# Patient Record
Sex: Female | Born: 1960 | Race: White | Hispanic: No | Marital: Married | Smoking: Former smoker
Health system: Southern US, Community
[De-identification: ages and names within clinical notes are randomized; demographics above are authoritative.]

## PROBLEM LIST (undated history)

## (undated) DIAGNOSIS — J45909 Unspecified asthma, uncomplicated: Secondary | ICD-10-CM

## (undated) DIAGNOSIS — E039 Hypothyroidism, unspecified: Secondary | ICD-10-CM

## (undated) DIAGNOSIS — I341 Nonrheumatic mitral (valve) prolapse: Secondary | ICD-10-CM

## (undated) DIAGNOSIS — T7840XA Allergy, unspecified, initial encounter: Secondary | ICD-10-CM

## (undated) DIAGNOSIS — J349 Unspecified disorder of nose and nasal sinuses: Secondary | ICD-10-CM

## (undated) DIAGNOSIS — Z8709 Personal history of other diseases of the respiratory system: Secondary | ICD-10-CM

## (undated) HISTORY — DX: Personal history of other diseases of the respiratory system: Z87.09

## (undated) HISTORY — DX: Allergy, unspecified, initial encounter: T78.40XA

## (undated) HISTORY — DX: Unspecified asthma, uncomplicated: J45.909

## (undated) HISTORY — DX: Unspecified disorder of nose and nasal sinuses: J34.9

---

## 1995-04-12 HISTORY — PX: BREAST LUMPECTOMY: SHX2

## 1998-02-19 ENCOUNTER — Other Ambulatory Visit: Admission: RE | Admit: 1998-02-19 | Discharge: 1998-02-19 | Payer: Self-pay | Admitting: Obstetrics and Gynecology

## 1999-03-25 ENCOUNTER — Other Ambulatory Visit: Admission: RE | Admit: 1999-03-25 | Discharge: 1999-03-25 | Payer: Self-pay | Admitting: Obstetrics and Gynecology

## 1999-03-30 ENCOUNTER — Encounter: Admission: RE | Admit: 1999-03-30 | Discharge: 1999-03-30 | Payer: Self-pay | Admitting: Obstetrics and Gynecology

## 1999-03-30 ENCOUNTER — Encounter: Payer: Self-pay | Admitting: Obstetrics and Gynecology

## 2000-04-06 ENCOUNTER — Other Ambulatory Visit: Admission: RE | Admit: 2000-04-06 | Discharge: 2000-04-06 | Payer: Self-pay | Admitting: Obstetrics and Gynecology

## 2001-05-21 ENCOUNTER — Other Ambulatory Visit: Admission: RE | Admit: 2001-05-21 | Discharge: 2001-05-21 | Payer: Self-pay | Admitting: Obstetrics and Gynecology

## 2001-11-14 ENCOUNTER — Encounter: Payer: Self-pay | Admitting: Obstetrics and Gynecology

## 2001-11-14 ENCOUNTER — Encounter: Admission: RE | Admit: 2001-11-14 | Discharge: 2001-11-14 | Payer: Self-pay | Admitting: Obstetrics and Gynecology

## 2002-06-03 ENCOUNTER — Other Ambulatory Visit: Admission: RE | Admit: 2002-06-03 | Discharge: 2002-06-03 | Payer: Self-pay | Admitting: Obstetrics and Gynecology

## 2002-12-03 ENCOUNTER — Encounter: Admission: RE | Admit: 2002-12-03 | Discharge: 2002-12-03 | Payer: Self-pay | Admitting: Obstetrics and Gynecology

## 2002-12-03 ENCOUNTER — Encounter: Payer: Self-pay | Admitting: Obstetrics and Gynecology

## 2003-06-17 ENCOUNTER — Other Ambulatory Visit: Admission: RE | Admit: 2003-06-17 | Discharge: 2003-06-17 | Payer: Self-pay | Admitting: Obstetrics and Gynecology

## 2004-03-19 ENCOUNTER — Encounter: Admission: RE | Admit: 2004-03-19 | Discharge: 2004-03-19 | Payer: Self-pay | Admitting: Obstetrics and Gynecology

## 2004-05-10 ENCOUNTER — Ambulatory Visit: Payer: Self-pay | Admitting: Internal Medicine

## 2004-05-24 ENCOUNTER — Ambulatory Visit: Payer: Self-pay | Admitting: Internal Medicine

## 2004-06-02 ENCOUNTER — Ambulatory Visit: Payer: Self-pay

## 2004-07-07 ENCOUNTER — Encounter: Admission: RE | Admit: 2004-07-07 | Discharge: 2004-10-05 | Payer: Self-pay | Admitting: Obstetrics and Gynecology

## 2005-04-18 ENCOUNTER — Encounter: Admission: RE | Admit: 2005-04-18 | Discharge: 2005-04-18 | Payer: Self-pay | Admitting: Obstetrics and Gynecology

## 2005-04-22 ENCOUNTER — Ambulatory Visit: Payer: Self-pay | Admitting: Internal Medicine

## 2008-03-16 ENCOUNTER — Emergency Department (HOSPITAL_BASED_OUTPATIENT_CLINIC_OR_DEPARTMENT_OTHER): Admission: EM | Admit: 2008-03-16 | Discharge: 2008-03-16 | Payer: Self-pay | Admitting: Emergency Medicine

## 2008-03-16 ENCOUNTER — Ambulatory Visit: Payer: Self-pay | Admitting: Radiology

## 2008-03-18 ENCOUNTER — Telehealth: Payer: Self-pay | Admitting: Internal Medicine

## 2008-03-20 ENCOUNTER — Telehealth (INDEPENDENT_AMBULATORY_CARE_PROVIDER_SITE_OTHER): Payer: Self-pay | Admitting: *Deleted

## 2008-03-20 ENCOUNTER — Ambulatory Visit: Payer: Self-pay | Admitting: Internal Medicine

## 2008-03-20 DIAGNOSIS — J939 Pneumothorax, unspecified: Secondary | ICD-10-CM | POA: Insufficient documentation

## 2008-03-20 DIAGNOSIS — J930 Spontaneous tension pneumothorax: Secondary | ICD-10-CM

## 2008-05-13 ENCOUNTER — Encounter: Admission: RE | Admit: 2008-05-13 | Discharge: 2008-05-13 | Payer: Self-pay | Admitting: Obstetrics and Gynecology

## 2009-03-17 ENCOUNTER — Telehealth (INDEPENDENT_AMBULATORY_CARE_PROVIDER_SITE_OTHER): Payer: Self-pay | Admitting: *Deleted

## 2009-03-17 DIAGNOSIS — R079 Chest pain, unspecified: Secondary | ICD-10-CM | POA: Insufficient documentation

## 2009-03-18 ENCOUNTER — Ambulatory Visit: Payer: Self-pay | Admitting: Internal Medicine

## 2009-03-19 ENCOUNTER — Telehealth (INDEPENDENT_AMBULATORY_CARE_PROVIDER_SITE_OTHER): Payer: Self-pay | Admitting: *Deleted

## 2009-03-19 ENCOUNTER — Encounter (INDEPENDENT_AMBULATORY_CARE_PROVIDER_SITE_OTHER): Payer: Self-pay | Admitting: *Deleted

## 2009-03-19 ENCOUNTER — Telehealth: Payer: Self-pay | Admitting: Family Medicine

## 2009-03-19 LAB — CONVERTED CEMR LAB
Basophils Relative: 1 % (ref 0.0–3.0)
CK-MB: 0.9 ng/mL (ref 0.3–4.0)
Eosinophils Relative: 3 % (ref 0.0–5.0)
Lymphocytes Relative: 31.8 % (ref 12.0–46.0)
Lymphs Abs: 2.4 10*3/uL (ref 0.7–4.0)
MCHC: 34 g/dL (ref 30.0–36.0)
MCV: 90.6 fL (ref 78.0–100.0)
Monocytes Absolute: 0.4 10*3/uL (ref 0.1–1.0)
Monocytes Relative: 5.2 % (ref 3.0–12.0)
RBC: 3.96 M/uL (ref 3.87–5.11)
RDW: 11.6 % (ref 11.5–14.6)
WBC: 7.4 10*3/uL (ref 4.5–10.5)

## 2009-06-05 ENCOUNTER — Encounter: Admission: RE | Admit: 2009-06-05 | Discharge: 2009-06-05 | Payer: Self-pay | Admitting: Obstetrics and Gynecology

## 2009-06-11 ENCOUNTER — Encounter: Admission: RE | Admit: 2009-06-11 | Discharge: 2009-06-11 | Payer: Self-pay | Admitting: Obstetrics and Gynecology

## 2009-09-10 ENCOUNTER — Encounter (INDEPENDENT_AMBULATORY_CARE_PROVIDER_SITE_OTHER): Payer: Self-pay | Admitting: *Deleted

## 2009-09-10 ENCOUNTER — Ambulatory Visit: Payer: Self-pay | Admitting: Internal Medicine

## 2009-09-10 DIAGNOSIS — R51 Headache: Secondary | ICD-10-CM

## 2009-09-10 DIAGNOSIS — E039 Hypothyroidism, unspecified: Secondary | ICD-10-CM | POA: Insufficient documentation

## 2009-09-10 DIAGNOSIS — R519 Headache, unspecified: Secondary | ICD-10-CM | POA: Insufficient documentation

## 2009-09-14 LAB — CONVERTED CEMR LAB: TSH: 1.83 microintl units/mL (ref 0.35–5.50)

## 2010-05-12 ENCOUNTER — Other Ambulatory Visit: Payer: Self-pay | Admitting: Obstetrics and Gynecology

## 2010-05-12 DIAGNOSIS — Z1239 Encounter for other screening for malignant neoplasm of breast: Secondary | ICD-10-CM

## 2010-05-13 NOTE — Letter (Signed)
Summary: Work Dietitian at [employer]  [address] [place], [place] [address]   Phone: [phone]  Fax: [phone]    Today's Date: September 10, 2009  Name of Patient: Elizabeth Roth  The above named patient had a medical visit today at:  am / 1:30pm.  Please take this into consideration when reviewing the time away from work/school.    Special Instructions:  [  ] None  [X To be off the remainder of today, returning to the normal work / school schedule tomorrow.  [  ] To be off until the next scheduled appointment on ______________________.  [  ] Other ________________________________________________________________ ________________________________________________________________________   Sincerely yours,   Shonna Chock

## 2010-05-13 NOTE — Assessment & Plan Note (Signed)
Summary: headache- lab tsh:244.9 for ins app/cbs   Vital Signs:  Patient profile:   50 year old female Weight:      153 pounds Temp:     98.4 degrees F oral Pulse rate:   60 / minute Resp:     15 per minute BP sitting:   130 / 82  (left arm) Cuff size:   large  Vitals Entered By: Shonna Chock (September 10, 2009 1:45 PM) CC: Headache-? Sinus related, upper neck is tender to the touch, and TSH check, Headaches Comments REVIEWED MED LIST, PATIENT AGREED DOSE AND INSTRUCTION CORRECT    CC:  Headache-? Sinus related, upper neck is tender to the touch, and TSH check, and Headaches.  History of Present Illness:  Headaches      This is a 50 year old woman who presents with Headaches X 1 week.  The patient reports nausea, tearing of eyes, nasal congestion, sinus pain, sinus pressure, and photophobia, but denies vomiting, sweats, and phonophobia.  The headache is described as constant and dull.  The location of the pain is  frontal &  bilateral.  The patient denies the following high-risk features: fever, neck pain/stiffness, vision loss or change, focal weakness, altered mental status, rash, trauma, pain worse with exertion, and new type of headache. There is some tenderness @ the posterior inferior scalp line The headaches are precipitated by change in weather.  Prior treatment has included a NSAID  ( Aleve D) and Excedrin Migraine with partial benefit . PMH of Menstrual Migraine last week of May. No excess caffeine  , but Aleve & Excedrin Migraine  once daily on average for past week. Prodrome of fatigue & OS twitching ; no aura with  migraines.  Allergies: 1)  ! Erythromycin  Past History:  Past Medical History: Migraines , Menstrual, PMH of  Review of Systems General:  Denies chills and sweats. Eyes:  Denies blurring, double vision, and vision loss-both eyes. ENT:  Complains of postnasal drainage; denies ear discharge; No facial pain or purulence. Ear pressure. Resp:  Complains of cough;  denies sputum productive. Neuro:  Denies brief paralysis, numbness, tingling, and weakness; Pain in RUE from neck to shoulder lifting suitcase 05/19.  Physical Exam  General:  well-nourished,in no acute distress; alert,appropriate and cooperative throughout examination Eyes:  No corneal or conjunctival inflammation noted. EOMI. Perrla. Field of Vision grossly normal. Vision WNL Ears:  External ear exam shows no significant lesions or deformities.  Otoscopic examination reveals clear canals, tympanic membranes are intact bilaterally without bulging, retraction, inflammation or discharge. Hearing is grossly normal bilaterally. Nose:  External nasal examination shows no deformity or inflammation. Nasal mucosa are pink and moist without lesions or exudates. Mouth:  Oral mucosa and oropharynx without lesions or exudates.  Teeth in good repair. Neck:  No deformities, masses, or tenderness noted. Supple Lungs:  Normal respiratory effort, chest expands symmetrically. Lungs are clear to auscultation, no crackles or wheezes. Heart:  Normal rate and regular rhythm. S1 and S2 normal without gallop, murmur, click, rub . S4 Pulses:  R and L carotid  pulses are full and equal bilaterally Extremities:  No clubbing, cyanosis, edema. Neg SLR Neurologic:  alert & oriented X3, cranial nerves II-XII intact, strength normal in all extremities, sensation intact to light touch,heel/ toe gait normal, DTRs symmetrical and normal, finger-to-nose normal, and Romberg negative.   Skin:  Intact without suspicious lesions or rashes Cervical Nodes:  No lymphadenopathy noted Axillary Nodes:  No palpable lymphadenopathy Psych:  memory intact for recent and remote, normally interactive, and good eye contact.     Impression & Recommendations:  Problem # 1:  HEADACHE (ICD-784.0)  Chronic Daily Headache as converted Migraine due to caffeine & NSAIDS  Her updated medication list for this problem includes:    Maxalt-mlt 10 Mg  Tbdp (Rizatriptan benzoate) .Marland Kitchen... 1 once daily as needed migraine  Complete Medication List: 1)  Synthroid 50 Mcg Tabs (Levothyroxine sodium) .Marland Kitchen.. 1 by mouth once daily 2)  Trinessa (28) 0.035 Mg Tabs (Norgestimate-ethinyl estradiol) .... As directed 3)  Ranitidine Hcl 150 Mg Tabs (Ranitidine hcl) .Marland Kitchen.. 1 two times a day pre meal 4)  Maxalt-mlt 10 Mg Tbdp (Rizatriptan benzoate) .Marland Kitchen.. 1 once daily as needed migraine 5)  Fluticasone Propionate 50 Mcg/act Susp (Fluticasone propionate) .Marland Kitchen.. 1 spray two times a day as needed  Other Orders: TLB-TSH (Thyroid Stimulating Hormone) (84443-TSH)  Patient Instructions: 1)  Neti pot once daily as needed for congestion.Keep a   Headache Diary; take Excedrin Migraine only with the Prodrome. Prescriptions: FLUTICASONE PROPIONATE 50 MCG/ACT SUSP (FLUTICASONE PROPIONATE) 1 spray two times a day as needed  #1 x 5   Entered and Authorized by:   Marga Melnick MD   Signed by:   Marga Melnick MD on 09/10/2009   Method used:   Print then Give to Patient   RxID:   [id number] MAXALT-MLT 10 MG TBDP (RIZATRIPTAN BENZOATE) 1 once daily as needed migraine  #6 x 1   Entered and Authorized by:   Marga Melnick MD   Signed by:   Marga Melnick MD on 09/10/2009   Method used:   Print then Give to Patient   RxID:   [id number]

## 2010-06-18 ENCOUNTER — Ambulatory Visit
Admission: RE | Admit: 2010-06-18 | Discharge: 2010-06-18 | Disposition: A | Discharge status: Home / Self Care | Payer: 59 | Source: Ambulatory Visit | Attending: Obstetrics and Gynecology | Admitting: Obstetrics and Gynecology

## 2010-06-18 DIAGNOSIS — Z1239 Encounter for other screening for malignant neoplasm of breast: Secondary | ICD-10-CM

## 2010-07-27 ENCOUNTER — Inpatient Hospital Stay (INDEPENDENT_AMBULATORY_CARE_PROVIDER_SITE_OTHER)
Admission: RE | Admit: 2010-07-27 | Discharge: 2010-07-27 | Disposition: A | Discharge status: Home / Self Care | Payer: 59 | Source: Ambulatory Visit | Attending: Family Medicine | Admitting: Family Medicine

## 2010-07-27 DIAGNOSIS — J019 Acute sinusitis, unspecified: Secondary | ICD-10-CM

## 2010-10-11 ENCOUNTER — Encounter: Payer: Self-pay | Admitting: Family Medicine

## 2010-10-11 ENCOUNTER — Ambulatory Visit (HOSPITAL_BASED_OUTPATIENT_CLINIC_OR_DEPARTMENT_OTHER)
Admission: RE | Admit: 2010-10-11 | Discharge: 2010-10-11 | Disposition: A | Discharge status: Home / Self Care | Payer: 59 | Source: Ambulatory Visit | Attending: Family Medicine | Admitting: Family Medicine

## 2010-10-11 ENCOUNTER — Ambulatory Visit (INDEPENDENT_AMBULATORY_CARE_PROVIDER_SITE_OTHER): Payer: 59 | Admitting: Family Medicine

## 2010-10-11 VITALS — BP 130/82 | Temp 98.9°F | Wt 156.6 lb

## 2010-10-11 DIAGNOSIS — J40 Bronchitis, not specified as acute or chronic: Secondary | ICD-10-CM

## 2010-10-11 DIAGNOSIS — R059 Cough, unspecified: Secondary | ICD-10-CM | POA: Insufficient documentation

## 2010-10-11 DIAGNOSIS — R05 Cough: Secondary | ICD-10-CM | POA: Insufficient documentation

## 2010-10-11 MED ORDER — FLUTICASONE-SALMETEROL 250-50 MCG/DOSE IN AEPB: 1.0000 | INHALATION_SPRAY | Freq: Two times a day (BID) | RESPIRATORY_TRACT | Status: DC

## 2010-10-11 MED ORDER — AZITHROMYCIN 250 MG PO TABS: 250.0000 mg | ORAL_TABLET | Freq: Every day | ORAL | Status: AC

## 2010-10-11 MED ORDER — GUAIFENESIN-CODEINE 100-10 MG/5ML PO SYRP: 5.0000 mL | ORAL_SOLUTION | Freq: Two times a day (BID) | ORAL | Status: DC | PRN

## 2010-10-11 MED ORDER — BENZONATATE 200 MG PO CAPS: 200.0000 mg | ORAL_CAPSULE | Freq: Three times a day (TID) | ORAL | Status: AC | PRN

## 2010-10-11 NOTE — Progress Notes (Signed)
  Subjective:    Patient ID: Garret Reddish, female    DOB: 01-Jan-1961, 50 y.o.   MRN: [id number]  HPI URI- sxs started Friday w/ sore throat, runny nose, slight cough.  Cough has progressed and now chest is tight w/ some shortness of breath.  Reports Dr Alwyn Ren previously gave her Advair which improved her sxs.  No fevers.  No ear pain.  No facial pain/pressure.  Biggest concern is chest tightness.   Review of Systems For ROS see HPI     Objective:   Physical Exam  Vitals reviewed. Constitutional: She appears well-developed and well-nourished. No distress.  HENT:  Head: Normocephalic and atraumatic.       TMs normal bilaterally Mild nasal congestion Throat w/out erythema, edema, or exudate  Eyes: Conjunctivae and EOM are normal. Pupils are equal, round, and reactive to light.  Neck: Normal range of motion. Neck supple.  Cardiovascular: Normal rate, regular rhythm, normal heart sounds and intact distal pulses.   No murmur heard. Pulmonary/Chest: Effort normal and breath sounds normal. No respiratory distress. She has no wheezes.       + hacking cough  Lymphadenopathy:    She has no cervical adenopathy.          Assessment & Plan:

## 2010-10-11 NOTE — Patient Instructions (Signed)
Go to the [facility] and get your [facility]- we'll call you with the results Take the Azithromycin as directed Use the cough meds as needed- the syrup will make you drowsy Use the inhaler as directed to open your lungs Call with any questions or concerns Hang in there!!!

## 2010-10-13 NOTE — Assessment & Plan Note (Signed)
Pt's hx and PE consistent w/ bronchitis.  Given hx of spontaneous pneumo will get CXR.  Start abx.  Cough meds prn.  Reviewed supportive care and red flags that should prompt return.  Pt expressed understanding and is in agreement w/ plan.

## 2010-12-14 ENCOUNTER — Other Ambulatory Visit: Payer: Self-pay | Admitting: Internal Medicine

## 2011-01-14 LAB — RAPID STREP SCREEN (MED CTR MEBANE ONLY): Streptococcus, Group A Screen (Direct): NEGATIVE

## 2011-07-18 ENCOUNTER — Other Ambulatory Visit: Payer: Self-pay | Admitting: Obstetrics and Gynecology

## 2011-07-18 DIAGNOSIS — Z1231 Encounter for screening mammogram for malignant neoplasm of breast: Secondary | ICD-10-CM

## 2011-07-22 ENCOUNTER — Ambulatory Visit
Admission: RE | Admit: 2011-07-22 | Discharge: 2011-07-22 | Disposition: A | Discharge status: Home / Self Care | Payer: 59 | Source: Ambulatory Visit | Attending: Obstetrics and Gynecology | Admitting: Obstetrics and Gynecology

## 2011-07-22 DIAGNOSIS — Z1231 Encounter for screening mammogram for malignant neoplasm of breast: Secondary | ICD-10-CM

## 2011-09-13 ENCOUNTER — Other Ambulatory Visit: Payer: Self-pay | Admitting: Obstetrics and Gynecology

## 2011-09-14 ENCOUNTER — Encounter (HOSPITAL_COMMUNITY): Payer: Self-pay

## 2011-09-20 ENCOUNTER — Encounter (HOSPITAL_COMMUNITY): Payer: Self-pay

## 2011-09-20 ENCOUNTER — Encounter (HOSPITAL_COMMUNITY)
Admission: RE | Admit: 2011-09-20 | Discharge: 2011-09-20 | Disposition: A | Discharge status: Home / Self Care | Payer: 59 | Source: Ambulatory Visit | Attending: Obstetrics and Gynecology | Admitting: Obstetrics and Gynecology

## 2011-09-20 HISTORY — DX: Hypothyroidism, unspecified: E03.9

## 2011-09-20 HISTORY — DX: Nonrheumatic mitral (valve) prolapse: I34.1

## 2011-09-20 LAB — CBC
MCH: 29.8 pg (ref 26.0–34.0)
MCHC: 33.5 g/dL (ref 30.0–36.0)
RDW: 12.5 % (ref 11.5–15.5)
WBC: 8.1 10*3/uL (ref 4.0–10.5)

## 2011-09-20 LAB — SURGICAL PCR SCREEN: MRSA, PCR: NEGATIVE

## 2011-09-20 NOTE — Patient Instructions (Signed)
YOUR PROCEDURE IS SCHEDULED ON:09/26/11  ENTER THROUGH THE MAIN ENTRANCE OF [facility] AT:6am  USE DESK PHONE AND DIAL [phone] TO INFORM us OF YOUR ARRIVAL  CALL [phone] IF YOU HAVE ANY QUESTIONS OR PROBLEMS PRIOR TO YOUR ARRIVAL.  REMEMBER: DO NOT EAT OR DRINK AFTER MIDNIGHT :Sunday    YOU MAY BRUSH YOUR TEETH THE MORNING OF SURGERY   TAKE THESE MEDICINES THE DAY OF SURGERY WITH SIP OF WATER:thyroid med   DO NOT WEAR JEWELRY, EYE MAKEUP, LIPSTICK OR DARK FINGERNAIL POLISH DO NOT WEAR LOTIONS  DO NOT SHAVE FOR 48 HOURS PRIOR TO SURGERY  YOU WILL NOT BE ALLOWED TO DRIVE YOURSELF HOME.

## 2011-09-24 NOTE — H&P (Signed)
Elizabeth Roth, Elizabeth Roth               ACCOUNT NO.:  [id number]  MEDICAL RECORD NO.:  [id number]  LOCATION:  SDC                           FACILITY:  [facility]  PHYSICIAN:  Lenoard Aden, M.D.DATE OF BIRTH:  1960/12/15  DATE OF ADMISSION:  09/20/2011 DATE OF DISCHARGE:  09/20/2011                             HISTORY & PHYSICAL   CHIEF COMPLAINT:  Symptomatic fibroids and pelvic pain.  HISTORY OF PRESENT ILLNESS:  A 51 year old white female, G2, P2 with symptomatic enlarging fibroids for definitive therapy.  ALLERGIES:  She has allergies to LATEX, PENICILLIN, and ERYTHROMYCIN.  SOCIAL HISTORY:  She is a nonsmoker, nondrinker.  She denies domestic or physical violence.  FAMILY HISTORY:  Chronic hypertension, heart disease, diabetes, breast cancer.  PAST MEDICAL HISTORY:  She has a history of 2 vaginal deliveries.  No other surgical or medical hospitalizations.  MEDICATIONS:  Synthroid and birth control pills.  PHYSICAL EXAMINATION:  GENERAL:  She is a well-developed, well- nourished, white female. VITAL SIGNS:  Height is 64 inches, weight of 145 pounds. HEENT:  Normal. NECK:  Supple.  Full range of motion. LUNGS:  Clear to auscultation. HEART:  Regular rate and rhythm. ABDOMEN:  Soft, nontender. PELVIC:  The uterus to be about 10- to 12-week size.  No adnexal masses. EXTREMITIES:  Without cords. NEUROLOGIC:  Nonfocal. SKIN:  Intact.  IMPRESSION:  Symptomatic enlarging fibroids for definitive therapy.  PLAN:  Proceed with da Vinci TLH, bilateral salpingectomy.  Risks of anesthesia, infection, bleeding, injury to abdominal organs, need for repair is discussed, delayed verus immediate complications to include bowel or bladder injury noted.  The patient acknowledges and wishes to proceed.     Lenoard Aden, M.D.     RJT/MEDQ  D:  09/24/2011  T:  09/24/2011  Job:  [id number]

## 2011-09-26 ENCOUNTER — Encounter (HOSPITAL_COMMUNITY): Payer: Self-pay | Admitting: Anesthesiology

## 2011-09-26 ENCOUNTER — Encounter (HOSPITAL_COMMUNITY): Payer: Self-pay

## 2011-09-26 ENCOUNTER — Ambulatory Visit (HOSPITAL_COMMUNITY): Payer: 59 | Admitting: Anesthesiology

## 2011-09-26 ENCOUNTER — Encounter (HOSPITAL_COMMUNITY)
Admission: RE | Disposition: A | Discharge status: Home / Self Care | Payer: Self-pay | Source: Ambulatory Visit | Attending: Obstetrics and Gynecology

## 2011-09-26 ENCOUNTER — Ambulatory Visit (HOSPITAL_COMMUNITY)
Admission: RE | Admit: 2011-09-26 | Discharge: 2011-09-27 | Disposition: A | Discharge status: Home / Self Care | Payer: 59 | Source: Ambulatory Visit | Attending: Obstetrics and Gynecology | Admitting: Obstetrics and Gynecology

## 2011-09-26 DIAGNOSIS — D250 Submucous leiomyoma of uterus: Secondary | ICD-10-CM | POA: Insufficient documentation

## 2011-09-26 DIAGNOSIS — D252 Subserosal leiomyoma of uterus: Secondary | ICD-10-CM | POA: Insufficient documentation

## 2011-09-26 DIAGNOSIS — Z01812 Encounter for preprocedural laboratory examination: Secondary | ICD-10-CM | POA: Insufficient documentation

## 2011-09-26 DIAGNOSIS — Z01818 Encounter for other preprocedural examination: Secondary | ICD-10-CM | POA: Insufficient documentation

## 2011-09-26 HISTORY — PX: TOTAL ABDOMINAL HYSTERECTOMY: SHX209

## 2011-09-26 LAB — HCG, SERUM, QUALITATIVE: Preg, Serum: NEGATIVE

## 2011-09-26 SURGERY — ROBOTIC ASSISTED TOTAL HYSTERECTOMY
Anesthesia: General | Wound class: Clean Contaminated

## 2011-09-26 MED ORDER — CEFAZOLIN SODIUM 1-5 GM-% IV SOLN
INTRAVENOUS | Status: AC
  Filled 2011-09-26: qty 50

## 2011-09-26 MED ORDER — DIPHENHYDRAMINE HCL 50 MG/ML IJ SOLN
INTRAMUSCULAR | Status: AC
  Filled 2011-09-26: qty 1

## 2011-09-26 MED ORDER — ROCURONIUM BROMIDE 50 MG/5ML IV SOLN
INTRAVENOUS | Status: AC
  Filled 2011-09-26: qty 1

## 2011-09-26 MED ORDER — TRAMADOL HCL 50 MG PO TABS: 50.0000 mg | ORAL_TABLET | Freq: Four times a day (QID) | ORAL | Status: DC | PRN

## 2011-09-26 MED ORDER — MEPERIDINE HCL 25 MG/ML IJ SOLN: 6.2500 mg | INTRAMUSCULAR | Status: DC | PRN

## 2011-09-26 MED ORDER — NALOXONE HCL 0.4 MG/ML IJ SOLN: 0.4000 mg | INTRAMUSCULAR | Status: DC | PRN

## 2011-09-26 MED ORDER — ONDANSETRON HCL 4 MG/2ML IJ SOLN: 4.0000 mg | Freq: Four times a day (QID) | INTRAMUSCULAR | Status: DC | PRN

## 2011-09-26 MED ORDER — DIPHENHYDRAMINE HCL 12.5 MG/5ML PO ELIX: 12.5000 mg | ORAL_SOLUTION | Freq: Four times a day (QID) | ORAL | Status: DC | PRN

## 2011-09-26 MED ORDER — FENTANYL CITRATE 0.05 MG/ML IJ SOLN
INTRAMUSCULAR | Status: AC
  Administered 2011-09-26: 50 ug via INTRAVENOUS
  Filled 2011-09-26: qty 2

## 2011-09-26 MED ORDER — FENTANYL CITRATE 0.05 MG/ML IJ SOLN
25.0000 ug | INTRAMUSCULAR | Status: DC | PRN
  Administered 2011-09-26 (×2): 50 ug via INTRAVENOUS

## 2011-09-26 MED ORDER — PROPOFOL 10 MG/ML IV EMUL
INTRAVENOUS | Status: DC | PRN
  Administered 2011-09-26: 50 mg via INTRAVENOUS
  Administered 2011-09-26: 150 mg via INTRAVENOUS

## 2011-09-26 MED ORDER — DIPHENHYDRAMINE HCL 50 MG/ML IJ SOLN
INTRAMUSCULAR | Status: DC | PRN
  Administered 2011-09-26: 12.5 mg via INTRAVENOUS

## 2011-09-26 MED ORDER — DEXAMETHASONE SODIUM PHOSPHATE 10 MG/ML IJ SOLN
INTRAMUSCULAR | Status: DC | PRN
  Administered 2011-09-26: 10 mg via INTRAVENOUS

## 2011-09-26 MED ORDER — LIDOCAINE HCL (CARDIAC) 20 MG/ML IV SOLN
INTRAVENOUS | Status: AC
  Filled 2011-09-26: qty 5

## 2011-09-26 MED ORDER — ALBUTEROL SULFATE HFA 108 (90 BASE) MCG/ACT IN AERS
INHALATION_SPRAY | RESPIRATORY_TRACT | Status: DC | PRN
  Administered 2011-09-26: 2 via RESPIRATORY_TRACT

## 2011-09-26 MED ORDER — HYDROMORPHONE HCL PF 1 MG/ML IJ SOLN
INTRAMUSCULAR | Status: DC | PRN
  Administered 2011-09-26: 1 mg via INTRAVENOUS

## 2011-09-26 MED ORDER — ARTIFICIAL TEARS OP OINT
TOPICAL_OINTMENT | OPHTHALMIC | Status: AC
  Filled 2011-09-26: qty 3.5

## 2011-09-26 MED ORDER — KETOROLAC TROMETHAMINE 30 MG/ML IJ SOLN
INTRAMUSCULAR | Status: DC | PRN
  Administered 2011-09-26: 30 mg via INTRAVENOUS

## 2011-09-26 MED ORDER — MIDAZOLAM HCL 2 MG/2ML IJ SOLN
INTRAMUSCULAR | Status: AC
  Filled 2011-09-26: qty 2

## 2011-09-26 MED ORDER — ARTIFICIAL TEARS OP OINT
TOPICAL_OINTMENT | OPHTHALMIC | Status: DC | PRN
  Administered 2011-09-26: 1 via OPHTHALMIC

## 2011-09-26 MED ORDER — DEXAMETHASONE SODIUM PHOSPHATE 10 MG/ML IJ SOLN
INTRAMUSCULAR | Status: AC
  Filled 2011-09-26: qty 1

## 2011-09-26 MED ORDER — LACTATED RINGERS IV SOLN
INTRAVENOUS | Status: DC
  Administered 2011-09-26: 10:00:00 via INTRAVENOUS
  Administered 2011-09-26: 125 mL/h via INTRAVENOUS
  Administered 2011-09-26: 08:00:00 via INTRAVENOUS

## 2011-09-26 MED ORDER — METOCLOPRAMIDE HCL 5 MG/ML IJ SOLN
INTRAMUSCULAR | Status: DC | PRN
  Administered 2011-09-26: 10 mg via INTRAVENOUS

## 2011-09-26 MED ORDER — ONDANSETRON HCL 4 MG/2ML IJ SOLN
INTRAMUSCULAR | Status: DC | PRN
  Administered 2011-09-26: 4 mg via INTRAVENOUS

## 2011-09-26 MED ORDER — SODIUM CHLORIDE 0.9 % IJ SOLN: 9.0000 mL | INTRAMUSCULAR | Status: DC | PRN

## 2011-09-26 MED ORDER — DIPHENHYDRAMINE HCL 50 MG/ML IJ SOLN: 12.5000 mg | Freq: Four times a day (QID) | INTRAMUSCULAR | Status: DC | PRN

## 2011-09-26 MED ORDER — BUPIVACAINE HCL (PF) 0.25 % IJ SOLN
INTRAMUSCULAR | Status: AC
  Filled 2011-09-26: qty 30

## 2011-09-26 MED ORDER — KETOROLAC TROMETHAMINE 30 MG/ML IJ SOLN
INTRAMUSCULAR | Status: AC
  Filled 2011-09-26: qty 1

## 2011-09-26 MED ORDER — MIDAZOLAM HCL 5 MG/5ML IJ SOLN
INTRAMUSCULAR | Status: DC | PRN
  Administered 2011-09-26: 2 mg via INTRAVENOUS

## 2011-09-26 MED ORDER — NEOSTIGMINE METHYLSULFATE 1 MG/ML IJ SOLN
INTRAMUSCULAR | Status: DC | PRN
  Administered 2011-09-26: 2 mg via INTRAVENOUS

## 2011-09-26 MED ORDER — FENTANYL CITRATE 0.05 MG/ML IJ SOLN
INTRAMUSCULAR | Status: AC
  Filled 2011-09-26: qty 5

## 2011-09-26 MED ORDER — GLYCOPYRROLATE 0.2 MG/ML IJ SOLN
INTRAMUSCULAR | Status: DC | PRN
  Administered 2011-09-26: 0.4 mg via INTRAVENOUS

## 2011-09-26 MED ORDER — HYDROMORPHONE HCL PF 1 MG/ML IJ SOLN
INTRAMUSCULAR | Status: AC
  Filled 2011-09-26: qty 1

## 2011-09-26 MED ORDER — SCOPOLAMINE 1 MG/3DAYS TD PT72
MEDICATED_PATCH | TRANSDERMAL | Status: AC
  Administered 2011-09-26: 1.5 mg via TRANSDERMAL
  Filled 2011-09-26: qty 1

## 2011-09-26 MED ORDER — CEFAZOLIN SODIUM 1-5 GM-% IV SOLN
1.0000 g | INTRAVENOUS | Status: AC
  Administered 2011-09-26: 1 g via INTRAVENOUS

## 2011-09-26 MED ORDER — FENTANYL CITRATE 0.05 MG/ML IJ SOLN
INTRAMUSCULAR | Status: DC | PRN
  Administered 2011-09-26 (×2): 50 ug via INTRAVENOUS
  Administered 2011-09-26: 100 ug via INTRAVENOUS
  Administered 2011-09-26: 50 ug via INTRAVENOUS

## 2011-09-26 MED ORDER — DEXTROSE IN LACTATED RINGERS 5 % IV SOLN
INTRAVENOUS | Status: DC
  Administered 2011-09-26: 19:00:00 via INTRAVENOUS

## 2011-09-26 MED ORDER — ZOLPIDEM TARTRATE 5 MG PO TABS: 5.0000 mg | ORAL_TABLET | Freq: Every evening | ORAL | Status: DC | PRN

## 2011-09-26 MED ORDER — METOCLOPRAMIDE HCL 5 MG/ML IJ SOLN: 10.0000 mg | Freq: Once | INTRAMUSCULAR | Status: DC | PRN

## 2011-09-26 MED ORDER — BUPIVACAINE HCL (PF) 0.25 % IJ SOLN
INTRAMUSCULAR | Status: DC | PRN
  Administered 2011-09-26: 11 mL

## 2011-09-26 MED ORDER — SCOPOLAMINE 1 MG/3DAYS TD PT72
1.0000 | MEDICATED_PATCH | TRANSDERMAL | Status: DC
  Administered 2011-09-26: 1.5 mg via TRANSDERMAL

## 2011-09-26 MED ORDER — ALBUTEROL SULFATE HFA 108 (90 BASE) MCG/ACT IN AERS
INHALATION_SPRAY | RESPIRATORY_TRACT | Status: AC
  Filled 2011-09-26: qty 6.7

## 2011-09-26 MED ORDER — RINGERS IRRIGATION IR SOLN
Status: DC | PRN
  Administered 2011-09-26: 1

## 2011-09-26 MED ORDER — OXYCODONE-ACETAMINOPHEN 5-325 MG PO TABS
1.0000 | ORAL_TABLET | ORAL | Status: DC | PRN
  Administered 2011-09-26 – 2011-09-27 (×3): 1 via ORAL
  Filled 2011-09-26 (×3): qty 1

## 2011-09-26 MED ORDER — ONDANSETRON HCL 4 MG/2ML IJ SOLN
INTRAMUSCULAR | Status: AC
  Filled 2011-09-26: qty 2

## 2011-09-26 MED ORDER — PROPOFOL 10 MG/ML IV EMUL
INTRAVENOUS | Status: AC
  Filled 2011-09-26: qty 20

## 2011-09-26 MED ORDER — ROCURONIUM BROMIDE 100 MG/10ML IV SOLN
INTRAVENOUS | Status: DC | PRN
  Administered 2011-09-26: 10 mg via INTRAVENOUS
  Administered 2011-09-26: 40 mg via INTRAVENOUS
  Administered 2011-09-26: 10 mg via INTRAVENOUS

## 2011-09-26 MED ORDER — LEVOTHYROXINE SODIUM 50 MCG PO TABS
50.0000 ug | ORAL_TABLET | Freq: Every day | ORAL | Status: DC
  Filled 2011-09-26 (×2): qty 1

## 2011-09-26 MED ORDER — HYDROMORPHONE 0.3 MG/ML IV SOLN
INTRAVENOUS | Status: DC
  Administered 2011-09-26: 12:00:00 via INTRAVENOUS
  Administered 2011-09-26: 1.39 mg via INTRAVENOUS
  Administered 2011-09-26: 2.19 mg via INTRAVENOUS
  Filled 2011-09-26: qty 25

## 2011-09-26 MED ORDER — METOCLOPRAMIDE HCL 5 MG/ML IJ SOLN
INTRAMUSCULAR | Status: AC
  Filled 2011-09-26: qty 2

## 2011-09-26 SURGICAL SUPPLY — 71 items
ADH SKN CLS APL DERMABOND .7 (GAUZE/BANDAGES/DRESSINGS) ×2
BAG URINE DRAINAGE (UROLOGICAL SUPPLIES) ×3 IMPLANT
BARRIER ADHS 3X4 INTERCEED (GAUZE/BANDAGES/DRESSINGS) ×3 IMPLANT
BRR ADH 4X3 ABS CNTRL BYND (GAUZE/BANDAGES/DRESSINGS) ×2
CABLE HIGH FREQUENCY MONO STRZ (ELECTRODE) ×3 IMPLANT
CATH FOLEY 3WAY  5CC 16FR (CATHETERS) ×1
CATH FOLEY 3WAY 5CC 16FR (CATHETERS) ×2 IMPLANT
CHLORAPREP W/TINT 26ML (MISCELLANEOUS) ×3 IMPLANT
CLOTH BEACON ORANGE TIMEOUT ST (SAFETY) ×3 IMPLANT
CONT PATH 16OZ SNAP LID 3702 (MISCELLANEOUS) ×3 IMPLANT
COVER MAYO STAND STRL (DRAPES) ×3 IMPLANT
COVER TABLE BACK 60X90 (DRAPES) ×6 IMPLANT
COVER TIP SHEARS 8 DVNC (MISCELLANEOUS) ×2 IMPLANT
COVER TIP SHEARS 8MM DA VINCI (MISCELLANEOUS) ×1
DECANTER SPIKE VIAL GLASS SM (MISCELLANEOUS) ×3 IMPLANT
DERMABOND ADVANCED (GAUZE/BANDAGES/DRESSINGS) ×1
DERMABOND ADVANCED .7 DNX12 (GAUZE/BANDAGES/DRESSINGS) ×2 IMPLANT
DRAPE HUG U DISPOSABLE (DRAPE) ×3 IMPLANT
DRAPE LG THREE QUARTER DISP (DRAPES) ×6 IMPLANT
DRAPE MONITOR DA VINCI (DRAPE) IMPLANT
DRAPE WARM FLUID 44X44 (DRAPE) ×3 IMPLANT
ELECT REM PT RETURN 9FT ADLT (ELECTROSURGICAL) ×3
ELECTRODE REM PT RTRN 9FT ADLT (ELECTROSURGICAL) ×2 IMPLANT
EVACUATOR SMOKE 8.L (FILTER) ×3 IMPLANT
GAUZE VASELINE 3X9 (GAUZE/BANDAGES/DRESSINGS) IMPLANT
GLOVE BIO SURGEON STRL SZ7.5 (GLOVE) ×9 IMPLANT
GOWN STRL REIN XL XLG (GOWN DISPOSABLE) ×18 IMPLANT
GYRUS RUMI II 2.5CM BLUE (DISPOSABLE)
GYRUS RUMI II 3.5CM BLUE (DISPOSABLE) ×3
GYRUS RUMI II 4.0CM BLUE (DISPOSABLE)
KIT ACCESSORY DA VINCI DISP (KITS) ×1
KIT ACCESSORY DVNC DISP (KITS) ×2 IMPLANT
KIT DISP ACCESSORY 4 ARM (KITS) ×1 IMPLANT
NDL INSUFFLATION 14GA 120MM (NEEDLE) ×2 IMPLANT
NEEDLE INSUFFLATION 14GA 120MM (NEEDLE) ×3 IMPLANT
PACK LAVH (CUSTOM PROCEDURE TRAY) ×3 IMPLANT
PAD PREP 24X48 CUFFED NSTRL (MISCELLANEOUS) ×6 IMPLANT
PLUG CATH AND CAP STER (CATHETERS) ×3 IMPLANT
PROTECTOR NERVE ULNAR (MISCELLANEOUS) ×6 IMPLANT
RUMI II 3.0CM BLUE KOH-EFFICIE (DISPOSABLE) ×1 IMPLANT
RUMI II GYRUS 2.5CM BLUE (DISPOSABLE) IMPLANT
RUMI II GYRUS 3.5CM BLUE (DISPOSABLE) IMPLANT
RUMI II GYRUS 4.0CM BLUE (DISPOSABLE) IMPLANT
SET CYSTO W/LG BORE CLAMP LF (SET/KITS/TRAYS/PACK) IMPLANT
SET IRRIG TUBING LAPAROSCOPIC (IRRIGATION / IRRIGATOR) ×3 IMPLANT
SOLUTION ELECTROLUBE (MISCELLANEOUS) ×3 IMPLANT
SPONGE LAP 18X18 X RAY DECT (DISPOSABLE) IMPLANT
SUT VIC AB 0 CT1 27 (SUTURE) ×6
SUT VIC AB 0 CT1 27XBRD ANBCTR (SUTURE) ×4 IMPLANT
SUT VIC AB 0 CT1 27XBRD ANTBC (SUTURE) IMPLANT
SUT VICRYL 0 UR6 27IN ABS (SUTURE) ×3 IMPLANT
SUT VICRYL RAPIDE 4/0 PS 2 (SUTURE) ×6 IMPLANT
SUT VLOC 180 0 9IN  GS21 (SUTURE) ×1
SUT VLOC 180 0 9IN GS21 (SUTURE) IMPLANT
SYR 50ML LL SCALE MARK (SYRINGE) ×3 IMPLANT
SYRINGE 10CC LL (SYRINGE) ×3 IMPLANT
SYSTEM CONVERTIBLE TROCAR (TROCAR) IMPLANT
TIP UTERINE 5.1X6CM LAV DISP (MISCELLANEOUS) IMPLANT
TIP UTERINE 6.7X10CM GRN DISP (MISCELLANEOUS) IMPLANT
TIP UTERINE 6.7X6CM WHT DISP (MISCELLANEOUS) IMPLANT
TIP UTERINE 6.7X8CM BLUE DISP (MISCELLANEOUS) ×1 IMPLANT
TOWEL OR 17X24 6PK STRL BLUE (TOWEL DISPOSABLE) ×9 IMPLANT
TROCAR DISP BLADELESS 8 DVNC (TROCAR) ×2 IMPLANT
TROCAR DISP BLADELESS 8MM (TROCAR) ×1
TROCAR XCEL 12X100 BLDLESS (ENDOMECHANICALS) IMPLANT
TROCAR XCEL NON-BLD 5MMX100MML (ENDOMECHANICALS) ×3 IMPLANT
TROCAR Z-THREAD 12X150 (TROCAR) ×3 IMPLANT
TROCAR Z-THREAD FIOS 12X100MM (TROCAR) IMPLANT
TUBING FILTER THERMOFLATOR (ELECTROSURGICAL) ×3 IMPLANT
WARMER LAPAROSCOPE (MISCELLANEOUS) ×3 IMPLANT
WATER STERILE IRR 1000ML POUR (IV SOLUTION) ×9 IMPLANT

## 2011-09-26 NOTE — Op Note (Signed)
09/26/2011  10:05 AM  PATIENT:  Elizabeth Roth  51 y.o. female  PRE-OPERATIVE DIAGNOSIS:  Symptomatic Fibroid  POST-OPERATIVE DIAGNOSIS:  Symptomatic Fibroid Pelvic adhesions Enterocele  PROCEDURE:  Procedure(s): ROBOTIC ASSISTED TOTAL HYSTERECTOMY BILATERAL SALPINGECTOMY LYSIS OF ADHESIONS MCCALL CUL DE PLASTY    SURGEON:  Surgeon(s): Lenoard Aden, MD Sheronette Cathie Beams, MD  ASSISTANTS: Cherly Hensen, MD   ANESTHESIA:   local and general  ESTIMATED BLOOD LOSS: 100cc  DRAINS: Urinary Catheter (Foley)   LOCAL MEDICATIONS USED:  MARCAINE     SPECIMEN:  Source of Specimen:  uterus, tubes and cervix  DISPOSITION OF SPECIMEN:  PATHOLOGY  COUNTS:  YES  DICTATION #: [id number]  PLAN OF CARE: DC home today  PATIENT DISPOSITION:  PACU - hemodynamically stable.

## 2011-09-26 NOTE — Anesthesia Procedure Notes (Addendum)
Procedure Name: Intubation Date/Time: 09/26/2011 7:49 AM Performed by: Cephus Shelling A Pre-anesthesia Checklist: Patient identified, Emergency Drugs available, Suction available and Patient being monitored Patient Re-evaluated:Patient Re-evaluated prior to inductionOxygen Delivery Method: Circle system utilized Preoxygenation: Pre-oxygenation with 100% oxygen Intubation Type: Combination inhalational/ intravenous induction and Cricoid Pressure applied Ventilation: Mask ventilation without difficulty Laryngoscope Size: Mac and 3 Grade View: Grade III Tube type: Oral Tube size: 7.0 mm Number of attempts: 2 (1st attempt by LB at 7:46 Grade 4 view even with sniffing position, 2 nd attempt FJ slight nick to L Lip ) Airway Equipment and Method: Stylet (pt in sniffing position) Placement Confirmation: positive ETCO2 and breath sounds checked- equal and bilateral Secured at: 20 cm Tube secured with: Tape Dental Injury: Injury to lip  Difficulty Due To: Difficulty was unanticipated Future Recommendations: Recommend- induction with short-acting agent, and alternative techniques readily available

## 2011-09-26 NOTE — Progress Notes (Signed)
Patient ID: Elizabeth Roth, female   DOB: April 06, 1961, 51 y.o.   MRN: [id number] Patient seen and examined. Consent witnessed and signed. No changes noted. Update completed.

## 2011-09-26 NOTE — Transfer of Care (Signed)
Immediate Anesthesia Transfer of Care Note  Patient: Elizabeth Roth  Procedure(s) Performed: Procedure(s) (LRB): ROBOTIC ASSISTED TOTAL HYSTERECTOMY (N/A) BILATERAL SALPINGECTOMY (Bilateral)  Patient Location: PACU  Anesthesia Type: General  Level of Consciousness: sedated  Airway & Oxygen Therapy: Patient Spontanous Breathing and Patient connected to nasal cannula oxygen  Post-op Assessment: Report given to PACU RN  Post vital signs: Reviewed and stable  Complications: No apparent anesthesia complications

## 2011-09-26 NOTE — Anesthesia Preprocedure Evaluation (Signed)
Anesthesia Evaluation  Patient identified by MRN, date of birth, ID band Patient awake    Reviewed: Allergy & Precautions, H&P , NPO status , Patient's Chart, lab work & pertinent test results  History of Anesthesia Complications (+) PONV  Airway Mallampati: III TM Distance: >3 FB Neck ROM: Full  Mouth opening: Limited Mouth Opening  Dental No notable dental hx. (+) Teeth Intact   Pulmonary  breath sounds clear to auscultation  Pulmonary exam normal       Cardiovascular + Valvular Problems/Murmurs MVP Rhythm:Regular Rate:Normal     Neuro/Psych  Headaches, negative psych ROS   GI/Hepatic negative GI ROS, Neg liver ROS,   Endo/Other  Hypothyroidism   Renal/GU negative Renal ROS  negative genitourinary   Musculoskeletal negative musculoskeletal ROS (+)   Abdominal   Peds  Hematology negative hematology ROS (+)   Anesthesia Other Findings   Reproductive/Obstetrics negative OB ROS                           Anesthesia Physical Anesthesia Plan  ASA: II  Anesthesia Plan: General   Post-op Pain Management:    Induction:   Airway Management Planned: Oral ETT  Additional Equipment:   Intra-op Plan:   Post-operative Plan: Extubation in OR  Informed Consent: I have reviewed the patients History and Physical, chart, labs and discussed the procedure including the risks, benefits and alternatives for the proposed anesthesia with the patient or authorized representative who has indicated his/her understanding and acceptance.   Dental advisory given  Plan Discussed with: Anesthesiologist, CRNA and Surgeon  Anesthesia Plan Comments:         Anesthesia Quick Evaluation

## 2011-09-26 NOTE — Anesthesia Postprocedure Evaluation (Signed)
Anesthesia Post Note  Patient: Elizabeth Roth  Procedure(s) Performed: Procedure(s) (LRB): ROBOTIC ASSISTED TOTAL HYSTERECTOMY (N/A) BILATERAL SALPINGECTOMY (Bilateral)  Anesthesia type: GA  Patient location: PACU  Post pain: Pain level controlled  Post assessment: Post-op Vital signs reviewed  Last Vitals:  Filed Vitals:   09/26/11 1045  BP: 156/68  Pulse: 62  Temp:   Resp: 16    Post vital signs: Reviewed  Level of consciousness: sedated  Complications: No apparent anesthesia complications

## 2011-09-26 NOTE — Op Note (Signed)
NAMEAZZURE, GARABEDIAN               ACCOUNT NO.:  [id number]  MEDICAL RECORD NO.:  [id number]  LOCATION:  9319                          FACILITY:  [facility]  PHYSICIAN:  Lenoard Aden, M.D.DATE OF BIRTH:  June 16, 1960  DATE OF PROCEDURE:  09/26/2011 DATE OF DISCHARGE:                              OPERATIVE REPORT   SURGEON:  Lenoard Aden, MD  OPERATIVE NOTE:  After being apprised of the risks of anesthesia, infection, bleeding, injury to abdominal organs, need for repair, delayed versus immediate complications include bowel and bladder injury, possible need for repair, the patient was brought to the operating room where she was administered general anesthetic without complications, prepped and draped in usual sterile fashion.  Foley catheter was placed. Exam under anesthesia revealed a 12-14-week size anteflexed uterus and no adnexal masses.  Cervix was easily dilated and sounds to 8 cm. Uterus sounds to 8 cm.  RUMI retractor was placed in standard fashion per vagina and manipulated in the usual fashion at this time.  Feet having been placed in the Yellofin stirrups as previously noted and a supraumbilical incision was made with scalpel.  Veress needle was placed, opening pressure -1, 4.5 liters of CO2 was insufflated without difficulty.  Trocar was placed.  Atraumatic trocar entry, visualized picture was taken.  Normal tubes, normal ovaries, normal liver and gallbladder bed.  There was apparently a normal appendix, which was encased in some adhesions.  There were adhesions of the left sigmoid mesentery completely obscuring the left adnexa.  Two robotic ports were placed on the right side, one on the left and a 5-mm port on the left and these trocars were all placed under direct visualization.  Steep Trendelenburg position was established and the robot was docked in the standard fashion using the right side docking.  At this time, attention was initially turned to the adhesions on  the left side, which were sharply lysed after entering the retroperitoneal space along the left side.  These were sharply lysed exposing and otherwise normal-appearing left adnexa.  The retroperitoneal space was dissected pushing the ureter off the medial leaf of the peritoneum.  A window was identified in the broad ligament.  The mesosalpinx was divided, the left tube was detached.  The window in the broad ligament was further developed and the tubo-ovarian ligament was clamped and ligated.  The round ligament was then divided, the bladder flap was developed sharply skeletonizing the left uterine vessel, which was then cauterized and coagulated in 3- point technique, but not cut.  Please note that the left ureter was coursing normally and was identified.  The right ureter was then identified easily.  The retroperitoneal space was entered cephalad to the round ligament on the right.  A window was made in the broad ligament and the tubo-ovarian ligament was cut.  The mesosalpinx was divided.  The right tube was detached.  The ureter was dissected, pushed off the medial leaf of the peritoneum.  The round ligament was divided after being cauterized in 3-point technique.  The bladder flap was developed further.  The RUMI cup was palpable at the cervical vaginal junction.  The right uterine vessel was skeletonized and cauterized  in a 3-point technique and cut cold.  Good hemostasis was noted.  The specimen was then detached circumferentially around the RUMI cup.  At this time, the bivalve technique was used to decompress the uterus and it was removed vaginally without difficulty.  At this time, the vaginal cuff was then closed using a 0 V-Loc in a continuous running fashion.  A second imbricating layer was placed and a McCall culdoplasty suture was placed as well.  Good hemostasis was noted.  Ureters appeared normal and peristalsing bilaterally normal.  The needle was retracted and  removed. Irrigation was accomplished.  Good hemostasis was noted.  Both ureters were normal.  Minimal blood loss achieved.  Urine was clear.  All robotic ports were then undocked and the robotic instruments were removed.  At this time, the further irrigation was accomplished.  Hemostasis was achieved.  All trocars were removed under direct visualization.  Incision was closed using 0 Vicryl, 4-0 Vicryl, and Dermabond.  Vaginal incision was inspected and found to be hemostatic and intact.  The patient tolerated the procedure, awakened and transferred to recovery in good condition.     Lenoard Aden, M.D.     RJT/MEDQ  D:  09/26/2011  T:  09/26/2011  Job:  [id number]

## 2011-09-27 ENCOUNTER — Encounter (HOSPITAL_COMMUNITY): Payer: Self-pay | Admitting: *Deleted

## 2011-09-27 LAB — CBC
MCH: 29.1 pg (ref 26.0–34.0)
MCV: 89.6 fL (ref 78.0–100.0)
Platelets: 281 10*3/uL (ref 150–400)
RDW: 12.5 % (ref 11.5–15.5)

## 2011-09-27 MED ORDER — TRAMADOL HCL 50 MG PO TABS: 50.0000 mg | ORAL_TABLET | Freq: Four times a day (QID) | ORAL | Status: AC | PRN

## 2011-09-27 MED ORDER — OXYCODONE-ACETAMINOPHEN 5-325 MG PO TABS: 1.0000 | ORAL_TABLET | ORAL | Status: AC | PRN

## 2011-09-27 NOTE — Progress Notes (Signed)
1 Day Post-Op Procedure(s) (LRB): ROBOTIC ASSISTED TOTAL HYSTERECTOMY (N/A) BILATERAL SALPINGECTOMY (Bilateral)  Subjective: Patient reports nausea, incisional pain, tolerating PO, + flatus and no problems voiding.    Objective: BP 154/80  Pulse 71  Temp 98.3 F (36.8 C) (Oral)  Resp 18  Ht 5\' 4"  (1.626 m)  Wt 69.854 kg (154 lb)  BMI 26.43 kg/m2  SpO2 99%  CBC    Component Value Date/Time   WBC 14.0* 09/27/2011 0600   RBC 3.75* 09/27/2011 0600   HGB 10.9* 09/27/2011 0600   HCT 33.6* 09/27/2011 0600   PLT 281 09/27/2011 0600   MCV 89.6 09/27/2011 0600   MCH 29.1 09/27/2011 0600   MCHC 32.4 09/27/2011 0600   RDW 12.5 09/27/2011 0600   LYMPHSABS 2.4 03/18/2009 1345   MONOABS 0.4 03/18/2009 1345   EOSABS 0.2 03/18/2009 1345   BASOSABS 0.1 03/18/2009 1345     I have reviewed patient's vital signs, intake and output, medications and labs.  General: alert, cooperative and appears stated age Resp: clear to auscultation bilaterally and normal percussion bilaterally Cardio: regular rate and rhythm, S1, S2 normal, no murmur, click, rub or gallop, normal apical impulse, regular rate and rhythm and no rub GI: soft, non-tender; bowel sounds normal; no masses,  no organomegaly and incision: clean, dry and intact Extremities: extremities normal, atraumatic, no cyanosis or edema, Homans sign is negative, no sign of DVT, no edema, redness or tenderness in the calves or thighs and no ulcers, gangrene or trophic changes Vaginal Bleeding: minimal  Assessment: s/p Procedure(s) (LRB): ROBOTIC ASSISTED TOTAL HYSTERECTOMY (N/A) BILATERAL SALPINGECTOMY (Bilateral): stable, progressing well and tolerating diet  Plan: Advance diet Encourage ambulation Advance to PO medication Discontinue IV fluids Discharge home  LOS: 1 day    Teffany Blaszczyk J 09/27/2011, 6:46 AM

## 2011-09-27 NOTE — Progress Notes (Signed)
Pt d/c  Ambulated out   Teaching complete

## 2011-12-30 ENCOUNTER — Ambulatory Visit (INDEPENDENT_AMBULATORY_CARE_PROVIDER_SITE_OTHER): Payer: 59 | Admitting: Internal Medicine

## 2011-12-30 ENCOUNTER — Encounter: Payer: Self-pay | Admitting: Internal Medicine

## 2011-12-30 VITALS — BP 124/78 | HR 87 | Temp 98.6°F | Wt 145.6 lb

## 2011-12-30 DIAGNOSIS — J069 Acute upper respiratory infection, unspecified: Secondary | ICD-10-CM

## 2011-12-30 DIAGNOSIS — J209 Acute bronchitis, unspecified: Secondary | ICD-10-CM

## 2011-12-30 MED ORDER — FLUTICASONE PROPIONATE 50 MCG/ACT NA SUSP: NASAL | Status: DC

## 2011-12-30 MED ORDER — HYDROCODONE-HOMATROPINE 5-1.5 MG/5ML PO SYRP: 5.0000 mL | ORAL_SOLUTION | Freq: Four times a day (QID) | ORAL | Status: DC | PRN

## 2011-12-30 MED ORDER — AMOXICILLIN-POT CLAVULANATE 875-125 MG PO TABS: 1.0000 | ORAL_TABLET | Freq: Two times a day (BID) | ORAL | Status: DC

## 2011-12-30 NOTE — Patient Instructions (Addendum)
Plain Mucinex for thick secretions ;force NON dairy fluids . Use a Neti pot daily as needed for sinus congestion; going from open side to congested side . Nasal cleansing in the shower as discussed. Make sure that all residual soap is removed to prevent irritation. Fluticasone 1 spray in each nostril twice a day as needed. Use the "crossover" technique as discussed. Plain Allegra 160 daily as needed for itchy eyes & sneezing.    

## 2011-12-30 NOTE — Progress Notes (Signed)
  Subjective:    Patient ID: Elizabeth Roth, female    DOB: 14-Jul-1960, 51 y.o.   MRN: [id number]  HPI Symptoms began 12/22/11 as head congestion with frontal headache. Her son &daughter have been ill with a similar illness. She subsequently developed left ear pain without discharge, tinnitus or hearing loss. This morning she awoke with chest congestion and has been producing scant yellow green sputum. She describes chest pressure without shortness of breath or wheezing.  She's been using Mucinex and over-the-counter cold preparations and nasal sprays with some benefit    Review of Systems She has had some sweats but no chills or fever. She's noted some discomfort in the left neck as well  She has not had facial pain, dental pain, or nasal purulence. She has had some scratchy throat.  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     Objective:   Physical Exam General appearance:good health ;well nourished; no acute distress or increased work of breathing is present.  No  lymphadenopathy about the head, neck, or axilla noted.   Eyes: No conjunctival inflammation or lid edema is present. Extraocular motion intact. Vision normal  Ears:  External ear exam shows no significant lesions or deformities.  Otoscopic examination reveals clear canals, tympanic membranes are intact bilaterally without bulging, retraction, inflammation or discharge. The left TM light reflex is slightly more dull than the right  Nose:  External nasal examination shows no deformity or inflammation. Nasal mucosa are dry without lesions or exudates. Slight R septal  deviation.No obstruction to airflow.   Oral exam: Dental hygiene is good; lips and gums are healthy appearing.There is no oropharyngeal erythema or exudate noted.   Neck:  No deformities,  masses, or tenderness noted. Full range of motion     Heart:  Normal rate and regular rhythm. S1 and S2 normal  without gallop, murmur, click, rub or other extra sounds.   Lungs:Chest clear to auscultation; no wheezes, rhonchi,rales ,or rubs present.No increased work of breathing.  Dry cough  Extremities:  No cyanosis, edema, or clubbing  noted    Skin: Warm & dry          Assessment & Plan:  #1 acute bronchitis w/o bronchospasm # 2 URI Plan: See orders and recommendations

## 2012-01-06 ENCOUNTER — Other Ambulatory Visit: Payer: Self-pay | Admitting: Internal Medicine

## 2012-04-20 ENCOUNTER — Telehealth: Payer: Self-pay | Admitting: Internal Medicine

## 2012-04-20 ENCOUNTER — Encounter: Payer: Self-pay | Admitting: Internal Medicine

## 2012-04-20 ENCOUNTER — Ambulatory Visit (INDEPENDENT_AMBULATORY_CARE_PROVIDER_SITE_OTHER): Payer: 59 | Admitting: Internal Medicine

## 2012-04-20 VITALS — BP 136/88 | HR 108 | Temp 98.5°F

## 2012-04-20 DIAGNOSIS — R079 Chest pain, unspecified: Secondary | ICD-10-CM

## 2012-04-20 DIAGNOSIS — J111 Influenza due to unidentified influenza virus with other respiratory manifestations: Secondary | ICD-10-CM

## 2012-04-20 DIAGNOSIS — R509 Fever, unspecified: Secondary | ICD-10-CM

## 2012-04-20 MED ORDER — OSELTAMIVIR PHOSPHATE 75 MG PO CAPS: 75.0000 mg | ORAL_CAPSULE | Freq: Two times a day (BID) | ORAL | Status: DC

## 2012-04-20 NOTE — Telephone Encounter (Signed)
Patient Information:  Caller Name: Arline Asp  Phone: [phone]  Patient: Elizabeth Roth, Elizabeth Roth  Gender: Female  DOB: 1960-06-08  Age: 52 Years  PCP: Marga Melnick  Pregnant: No  Office Follow Up:  Does the office need to follow up with this patient?: No  Instructions For The Office: N/A  RN Note:  Is wheezing, but states she always does when she coughs; feels like dull pain in chest and heavy or tight.  Denies nasal drainage and cough non productive at this time.  Has no thermometer to confirm but feels like she is running Roth fever.  Is complaining of aches.  Very teary during conversation.  Offered and earlier appointment due to the emotions heard but patient declined opting to wait for Dr. Alwyn Ren.  Instructed to call if got worse.  She has been doing the care advice, but is still not getting better.   Symptoms  Reason For Call & Symptoms: Head congestion, chest hurts,cough, and achy  Reviewed Health History In EMR: Yes  Reviewed Medications In EMR: Yes  Reviewed Allergies In EMR: Yes  Reviewed Surgeries / Procedures: Yes  Date of Onset of Symptoms: 04/12/2012  Treatments Tried: tried Roth z-pak she had on hand at home  Treatments Tried Worked: No OB / GYN:  LMP: Unknown  Guideline(s) Used:  Cough  Disposition Per Guideline:   See Today in Office  Reason For Disposition Reached:   Sinus pain persists after using nasal washes and pain medicine > 24 hours  Advice Given:  Call Back If:  You become worse.  Appointment Scheduled:  04/20/2012 16:30:00 Appointment Scheduled Provider:  Marga Melnick

## 2012-04-20 NOTE — Progress Notes (Signed)
  Subjective:    Patient ID: Elizabeth Roth, female    DOB: 01-26-1961, 52 y.o.   MRN: [id number]  HPI The respiratory tract symptoms began 04/12/12  as chest congestion , rhinitis, dry cough followed by some yellow sputum as of 1/4-5. Streaky hemoptysis X 1 Treatment with  Z pack( refill of daughter's ) beginning 1/2  was partially effective    Now significant associated symptoms included frontal headache, L facial pain,  L earache Fever to 100 with chills. Significant   sweats today. Diffuse joint pain.  Cough is associated with chest tightness, shortness of breath and wheezing . No sputum  described now Extrinsic symptoms of watery eyes w/o itching or sneezing present .    Her son has documented Flu & is on  Tamiflu    The patient had  quit smoking in 1980s             Review of Systems Symptoms not present include dental pain, sore throat, nasal purulence,  and otic discharge       Objective:   Physical Exam  General appearance:well nourished; in no acute distress or increased work of breathing is present but uncomfortable  No  lymphadenopathy about the head, neck, or axilla noted.  Eyes: No conjunctival inflammation or lid edema is present. . Ears:  External ear exam shows no significant lesions or deformities.  Otoscopic examination reveals clear canals, tympanic membranes are intact bilaterally without bulging, retraction, inflammation or discharge. Nose:  External nasal examination shows no deformity or inflammation. R nasal mucosa boggy & moist without lesions or exudates. No septal dislocation or deviation.No obstruction to airflow.  Oral exam: Dental hygiene is good; lips and gums are healthy appearing.There is no oropharyngeal erythema or exudate noted.  Neck:  No deformities or  Masses . Some  tenderness noted with ROM.   Supple with full range of motion without pain. NO MENINGISMUS Heart:  Slight tachycardia; regular rhythm. S1 and S2 normal without gallop,  murmur, click, rub or other extra sounds.  Lungs:Chest clear to auscultation; no wheezes, rhonchi,rales ,or rubs present.No increased work of breathing.   Abdomen: bowel sounds normal, soft and non-tender without masses, organomegaly or hernias noted.  No guarding or rebound  Extremities:  No cyanosis, edema, or clubbing  noted  Skin: Warm & dry w/o jaundice or tenting.             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

## 2012-04-20 NOTE — Patient Instructions (Addendum)
NSAIDS ( Aleve, Advil, Naproxen) or Tylenol every 4 hrs as needed for fever as discussed based on label recommendations .Stay well hydrated. Drink to thirst up to 40 ounces of fluids daily.  Active treatment for flu syndrome is one Tamiflu twice a day for 5 days. Prophylaxis is one pill daily for 10 days. Contact Dr Cyndia Diver & Dr Loyola Mast concerning prophylactic Tamiflu for your husband & Eileen Stanford. Message sent to Dr Rana Snare through [facility]

## 2012-04-20 NOTE — Telephone Encounter (Signed)
Patient on scheduled to be seen today, per Dr.Hopper if appointment scheduled ok to close encounter

## 2012-04-24 ENCOUNTER — Telehealth: Payer: Self-pay | Admitting: Internal Medicine

## 2012-04-24 MED ORDER — ALBUTEROL SULFATE HFA 108 (90 BASE) MCG/ACT IN AERS: INHALATION_SPRAY | RESPIRATORY_TRACT | Status: DC

## 2012-04-24 NOTE — Telephone Encounter (Signed)
OK #1 , R X 2

## 2012-04-24 NOTE — Telephone Encounter (Signed)
Appointment Scheduled:  04/25/2012 13:00:00  Appointment Scheduled Provider:  Marga Melnick

## 2012-04-24 NOTE — Telephone Encounter (Signed)
Hopp please advise, patient with pending appointment tomorrow but requesting rx for albuterol inhaler

## 2012-04-24 NOTE — Telephone Encounter (Signed)
Patient Information:  Caller Name: Arieonna  Phone: [phone]  Patient: Elizabeth Roth, Elizabeth Roth  Gender: Female  DOB: 08-27-1960  Age: 52 Years  PCP: Marga Melnick  Pregnant: No  Office Follow Up:  Does the office need to follow up with this patient?: Yes  Instructions For The Office: See note; asking for new Rx for Albuterol MDI since it has only been ordered by hospital MD  RN Note:  Diagnosed with Influenza 04/20/12.  Completes [place] 03/25/13.  Does not feel any improvement.  Occasional wheezing; improves with inhaler; has not used "rescue inhaler" since 04/20/12. Concerned about missing work. Has partial Albuterol MDI from hospitalization 6/13 without refills; PCP has never ordered it; has no refills.  Please order Albuterol ASAP. [facility].  Symptoms  Reason For Call & Symptoms: Influenza follow up call:  Reports significant  nasal and chest congestion; "unable to breath" unless uses nasal spray.  Reviewed Health History In EMR: Yes  Reviewed Medications In EMR: Yes  Reviewed Allergies In EMR: Yes  Reviewed Surgeries / Procedures: Yes  Date of Onset of Symptoms: 04/13/2012  Treatments Tried: nasal saline, Advair HFA  Treatments Tried Worked: Yes OB / GYN:  LMP: Unknown  Guideline(s) Used:  Influenza Follow-Up Call  Asthma Attack  Disposition Per Guideline:   See Today or Tomorrow in Office  Reason For Disposition Reached:   Intermittent mild wheezing persists > 5 days  Advice Given:  Long-Term-Control Asthma Medicine:  If you are using a controller medicine (e.g., inhaled steroids or cromolyn), continue to take it as directed.  Drinking Liquids:  Try to drink normal amount of liquids (e.g., water). Being adequately hydrated makes it easier to cough up the sticky lung mucus.  Humidifier:   If the air is dry, use a cool mist humidifier to prevent drying of the upper airway.  Appointment Scheduled:  04/25/2012 13:00:00 Appointment Scheduled Provider:  Marga Melnick

## 2012-04-24 NOTE — Telephone Encounter (Signed)
Patient aware rx sent in  

## 2012-04-25 ENCOUNTER — Ambulatory Visit (INDEPENDENT_AMBULATORY_CARE_PROVIDER_SITE_OTHER): Payer: 59 | Admitting: Internal Medicine

## 2012-04-25 ENCOUNTER — Encounter: Payer: Self-pay | Admitting: Internal Medicine

## 2012-04-25 VITALS — BP 128/84 | HR 84 | Temp 98.1°F | Wt 145.0 lb

## 2012-04-25 DIAGNOSIS — J069 Acute upper respiratory infection, unspecified: Secondary | ICD-10-CM

## 2012-04-25 DIAGNOSIS — J111 Influenza due to unidentified influenza virus with other respiratory manifestations: Secondary | ICD-10-CM

## 2012-04-25 MED ORDER — FLUTICASONE PROPIONATE 50 MCG/ACT NA SUSP: NASAL | Status: DC

## 2012-04-25 NOTE — Progress Notes (Signed)
  Subjective:    Patient ID: Elizabeth Roth, female    DOB: 1961/03/15, 52 y.o.   MRN: [id number]  HPI  She has been in bed since 04/20/12. She completes the Tamiflu today. She is significantly improved; arthralgias and myalgias have resolved.  Cough is productive of scant yellow sputum. The cough has been associated with some shortness of breath and wheezing. This has been responsive to albuterol metered-dose inhaler 1-2 puffs every 4 hours as needed  The major issue is head congestion which has been partially responsive to Vicks over-the-counter meds. A humidifier  seems to be of benefit    Review of Systems She denies frontal headache, facial pain, or nasal purulence .She has no fever, chills, or sweats at this time.      Objective:   Physical Exam General appearance:good health ;well nourished; no acute distress or increased work of breathing is present.  No  lymphadenopathy about the head, neck, or axilla noted.   Eyes: No conjunctival inflammation or lid edema is present.   Ears:  External ear exam shows no significant lesions or deformities.  Otoscopic examination reveals clear canals, tympanic membranes are intact bilaterally without bulging, retraction, inflammation or discharge.  Nose:  External nasal examination shows no deformity or inflammation. Nasal mucosa are dry without lesions or exudates. Slight polypoid change on L.No obstruction to airflow.   Oral exam: Dental hygiene is good; lips and gums are healthy appearing.There is no oropharyngeal erythema or exudate noted.   Neck:  No deformities,  masses, or tenderness noted.      Heart:  Normal rate and regular rhythm. S1 and S2 normal without gallop, murmur, click, rub or other extra sounds.   Lungs:Chest clear to auscultation; no wheezes, rhonchi,rales ,or rubs present.No increased work of breathing.  Dry cough  Extremities:  No cyanosis, edema, or clubbing  noted    Skin: Warm & dry         Assessment & Plan:    #1 resolving Flu syndrome Plan: See orders and recommendations

## 2012-04-25 NOTE — Patient Instructions (Addendum)
Please remain out of work until  04/26/2012; you should medically  be excused 1/10-1/15/14. Plain Mucinex (NOT D) for thick secretions ;force NON dairy fluids .   Nasal cleansing in the shower as discussed with lather of mild shampoo.After 10 seconds wash off lather while  exhaling through nostrils. Make sure that all residual soap is removed to prevent irritation.  Fluticasone 1 spray in each nostril twice a day as needed. Use the "crossover" technique into opposite nostril spraying toward opposite ear @ 45 degree angle, not straight up into nostril.  Use a Neti pot daily only  as needed for significant sinus congestion; going from open side to congested side . Plain Allegra (NOT D )  160 daily , Loratidine 10 mg , OR Zyrtec 10 mg @ bedtime  as needed for itchy eyes & sneezing.

## 2013-02-06 ENCOUNTER — Other Ambulatory Visit: Payer: Self-pay | Admitting: Internal Medicine

## 2013-02-07 ENCOUNTER — Other Ambulatory Visit: Payer: Self-pay | Admitting: *Deleted

## 2013-02-07 NOTE — Telephone Encounter (Signed)
Advair diskus refill sent to pharmacy

## 2013-02-07 NOTE — Telephone Encounter (Deleted)
Advair diskus refill sent to pharmacy. OV due

## 2013-07-23 ENCOUNTER — Other Ambulatory Visit: Payer: Self-pay | Admitting: Internal Medicine

## 2013-12-07 ENCOUNTER — Other Ambulatory Visit: Payer: Self-pay | Admitting: Internal Medicine

## 2014-04-14 ENCOUNTER — Other Ambulatory Visit: Payer: Self-pay | Admitting: Nurse Practitioner

## 2014-04-14 ENCOUNTER — Ambulatory Visit (INDEPENDENT_AMBULATORY_CARE_PROVIDER_SITE_OTHER): Payer: 59 | Admitting: Nurse Practitioner

## 2014-04-14 ENCOUNTER — Encounter: Payer: Self-pay | Admitting: Nurse Practitioner

## 2014-04-14 VITALS — BP 134/90 | HR 80 | Temp 98.1°F | Wt 161.0 lb

## 2014-04-14 DIAGNOSIS — J4521 Mild intermittent asthma with (acute) exacerbation: Secondary | ICD-10-CM

## 2014-04-14 DIAGNOSIS — J069 Acute upper respiratory infection, unspecified: Secondary | ICD-10-CM

## 2014-04-14 MED ORDER — PREDNISONE 10 MG PO TABS: ORAL_TABLET | ORAL | Status: DC

## 2014-04-14 MED ORDER — FLUTICASONE-SALMETEROL 250-50 MCG/DOSE IN AEPB: 1.0000 | INHALATION_SPRAY | Freq: Two times a day (BID) | RESPIRATORY_TRACT | Status: DC

## 2014-04-14 MED ORDER — ALBUTEROL SULFATE HFA 108 (90 BASE) MCG/ACT IN AERS: INHALATION_SPRAY | RESPIRATORY_TRACT | Status: DC

## 2014-04-14 NOTE — Patient Instructions (Signed)
You have a cold virus causing your symptoms. The average duration of cold symptoms is 14 days. Start daily sinus rinses (neilmed Sinus Rinse). Use 30 mg to 60 mg pseudoephedrine 2 to 3 times daily. Tylenol or ibuprophen for headache. Vicks vapor rub under nose to help breathe. Benzocaine throat lozenges for sore throat. Sip fluids every hour. Rest. If you are not feeling better in 10 days or develop fever or chest pain, call us for re-evaluation. Feel better!  Upper Respiratory Infection, Adult An upper respiratory infection (URI) is also sometimes known as the common cold. The upper respiratory tract includes the nose, sinuses, throat, trachea, and bronchi. Bronchi are the airways leading to the lungs. Most people improve within 1 week, but symptoms can last up to 2 weeks. A residual cough may last even longer.  CAUSES Many different viruses can infect the tissues lining the upper respiratory tract. The tissues become irritated and inflamed and often become very moist. Mucus production is also common. A cold is contagious. You can easily spread the virus to others by oral contact. This includes kissing, sharing a glass, coughing, or sneezing. Touching your mouth or nose and then touching a surface, which is then touched by another person, can also spread the virus. SYMPTOMS  Symptoms typically develop 1 to 3 days after you come in contact with a cold virus. Symptoms vary from person to person. They may include:  Runny nose.  Sneezing.  Nasal congestion.  Sinus irritation.  Sore throat.  Loss of voice (laryngitis).  Cough.  Fatigue.  Muscle aches.  Loss of appetite.  Headache.  Low-grade fever. DIAGNOSIS  You might diagnose your own cold based on familiar symptoms, since most people get a cold 2 to 3 times a year. Your caregiver can confirm this based on your exam. Most importantly, your caregiver can check that your symptoms are not due to another disease such as strep throat,  sinusitis, pneumonia, asthma, or epiglottitis. Blood tests, throat tests, and X-rays are not necessary to diagnose a common cold, but they may sometimes be helpful in excluding other more serious diseases. Your caregiver will decide if any further tests are required. RISKS AND COMPLICATIONS  You may be at risk for a more severe case of the common cold if you smoke cigarettes, have chronic heart disease (such as heart failure) or lung disease (such as asthma), or if you have a weakened immune system. The very young and very old are also at risk for more serious infections. Bacterial sinusitis, middle ear infections, and bacterial pneumonia can complicate the common cold. The common cold can worsen asthma and chronic obstructive pulmonary disease (COPD). Sometimes, these complications can require emergency medical care and may be life-threatening. PREVENTION  The best way to protect against getting a cold is to practice good hygiene. Avoid oral or hand contact with people with cold symptoms. Wash your hands often if contact occurs. There is no clear evidence that vitamin C, vitamin E, echinacea, or exercise reduces the chance of developing a cold. However, it is always recommended to get plenty of rest and practice good nutrition. TREATMENT  Treatment is directed at relieving symptoms. There is no cure. Antibiotics are not effective, because the infection is caused by a virus, not by bacteria. Treatment may include:  Increased fluid intake. Sports drinks offer valuable electrolytes, sugars, and fluids.  Breathing heated mist or steam (vaporizer or shower).  Eating chicken soup or other clear broths, and maintaining good nutrition.  Getting plenty  of rest.  Using gargles or lozenges for comfort.  Controlling fevers with ibuprofen or acetaminophen as directed by your caregiver.  Increasing usage of your inhaler if you have asthma. Zinc gel and zinc lozenges, taken in the first 24 hours of the common  cold, can shorten the duration and lessen the severity of symptoms. Pain medicines may help with fever, muscle aches, and throat pain. A variety of non-prescription medicines are available to treat congestion and runny nose. Your caregiver can make recommendations and may suggest nasal or lung inhalers for other symptoms.  HOME CARE INSTRUCTIONS   Only take over-the-counter or prescription medicines for pain, discomfort, or fever as directed by your caregiver.  Use a warm mist humidifier or inhale steam from a shower to increase air moisture. This may keep secretions moist and make it easier to breathe.  Drink enough water and fluids to keep your urine clear or pale yellow.  Rest as needed.  Return to work when your temperature has returned to normal or as your caregiver advises. You may need to stay home longer to avoid infecting others. You can also use a face mask and careful hand washing to prevent spread of the virus. SEEK MEDICAL CARE IF:   After the first few days, you feel you are getting worse rather than better.  You need your caregiver's advice about medicines to control symptoms.  You develop chills, worsening shortness of breath, or brown or red sputum. These may be signs of pneumonia.  You develop yellow or brown nasal discharge or pain in the face, especially when you bend forward. These may be signs of sinusitis.  You develop a fever, swollen neck glands, pain with swallowing, or white areas in the back of your throat. These may be signs of strep throat. SEEK IMMEDIATE MEDICAL CARE IF:   You have a fever.  You develop severe or persistent headache, ear pain, sinus pain, or chest pain.  You develop wheezing, a prolonged cough, cough up blood, or have a change in your usual mucus (if you have chronic lung disease).  You develop sore muscles or a stiff neck. Document Released: 09/21/2000 Document Revised: 06/20/2011 Document Reviewed: 07/30/2010 [facility] Patient  Information 2014 [place], [place].

## 2014-04-14 NOTE — Progress Notes (Signed)
Pre visit review using our clinic review tool, if applicable. No additional management support is needed unless otherwise documented below in the visit note. 

## 2014-04-14 NOTE — Progress Notes (Signed)
   Subjective:    Patient ID: Elizabeth Roth, female    DOB: 1961-01-10, 54 y.o.   MRN: [id number]  Cough This is a new problem. The current episode started in the past 7 days (3d). The problem has been unchanged. The problem occurs hourly. The cough is non-productive. Associated symptoms include chest pain, ear congestion, headaches, nasal congestion and postnasal drip. Pertinent negatives include no chills, ear pain, fever, sore throat, shortness of breath or wheezing. Nothing aggravates the symptoms. She has tried a beta-agonist inhaler (decongestant) for the symptoms. The treatment provided no relief.      Review of Systems  Constitutional: Positive for fatigue. Negative for fever and chills.  HENT: Positive for congestion and postnasal drip. Negative for ear pain and sore throat.   Respiratory: Positive for cough. Negative for chest tightness, shortness of breath and wheezing.   Cardiovascular: Positive for chest pain.  Neurological: Positive for headaches.       Objective:   Physical Exam  Constitutional: She is oriented to person, place, and time. She appears well-developed and well-nourished. No distress.  HENT:  Head: Normocephalic and atraumatic.  Right Ear: External ear normal.  Left Ear: External ear normal.  Mouth/Throat: Oropharynx is clear and moist. No oropharyngeal exudate.  Nasal quality to voice   Eyes: Conjunctivae are normal. Right eye exhibits no discharge. Left eye exhibits no discharge.  Neck: Normal range of motion. Neck supple. No thyromegaly present.  Cardiovascular: Normal rate, regular rhythm and normal heart sounds.   No murmur heard. Pulmonary/Chest: Effort normal and breath sounds normal. No respiratory distress. She has no wheezes.  Lymphadenopathy:    She has no cervical adenopathy.  Neurological: She is alert and oriented to person, place, and time.  Skin: Skin is warm and dry.  Psychiatric: She has a normal mood and affect. Her behavior is  normal. Thought content normal.  Vitals reviewed.         Assessment & Plan:  1. URI (upper respiratory infection) Symptom management Request refills on asthma meds - Fluticasone-Salmeterol (ADVAIR DISKUS) 250-50 MCG/DOSE AEPB; Inhale 1 puff into the lungs 2 (two) times daily.  Dispense: 60 each; Refill: 1 - albuterol (PROVENTIL HFA;VENTOLIN HFA) 108 (90 BASE) MCG/ACT inhaler; 1-2 puffs every 4-6 hours as needed  Dispense: 8.5 g; Refill: 2 F/u PRN

## 2015-01-02 ENCOUNTER — Other Ambulatory Visit: Payer: Self-pay | Admitting: Emergency Medicine

## 2015-01-02 DIAGNOSIS — J069 Acute upper respiratory infection, unspecified: Secondary | ICD-10-CM

## 2015-01-02 MED ORDER — FLUTICASONE-SALMETEROL 250-50 MCG/DOSE IN AEPB: 1.0000 | INHALATION_SPRAY | Freq: Two times a day (BID) | RESPIRATORY_TRACT | Status: DC

## 2015-03-18 ENCOUNTER — Encounter (HOSPITAL_COMMUNITY): Payer: Self-pay

## 2015-03-18 ENCOUNTER — Emergency Department (HOSPITAL_COMMUNITY): Payer: 59

## 2015-03-18 ENCOUNTER — Emergency Department (HOSPITAL_COMMUNITY)
Admission: EM | Admit: 2015-03-18 | Discharge: 2015-03-18 | Disposition: A | Discharge status: Home / Self Care | Payer: 59 | Attending: Emergency Medicine | Admitting: Emergency Medicine

## 2015-03-18 DIAGNOSIS — J45901 Unspecified asthma with (acute) exacerbation: Secondary | ICD-10-CM | POA: Diagnosis not present

## 2015-03-18 DIAGNOSIS — R42 Dizziness and giddiness: Secondary | ICD-10-CM | POA: Diagnosis not present

## 2015-03-18 DIAGNOSIS — E039 Hypothyroidism, unspecified: Secondary | ICD-10-CM | POA: Insufficient documentation

## 2015-03-18 DIAGNOSIS — Z8679 Personal history of other diseases of the circulatory system: Secondary | ICD-10-CM | POA: Insufficient documentation

## 2015-03-18 DIAGNOSIS — Z79899 Other long term (current) drug therapy: Secondary | ICD-10-CM | POA: Diagnosis not present

## 2015-03-18 DIAGNOSIS — Z91040 Latex allergy status: Secondary | ICD-10-CM | POA: Diagnosis not present

## 2015-03-18 DIAGNOSIS — Z7951 Long term (current) use of inhaled steroids: Secondary | ICD-10-CM | POA: Insufficient documentation

## 2015-03-18 DIAGNOSIS — R011 Cardiac murmur, unspecified: Secondary | ICD-10-CM | POA: Insufficient documentation

## 2015-03-18 DIAGNOSIS — Z87891 Personal history of nicotine dependence: Secondary | ICD-10-CM | POA: Diagnosis not present

## 2015-03-18 DIAGNOSIS — R109 Unspecified abdominal pain: Secondary | ICD-10-CM | POA: Insufficient documentation

## 2015-03-18 DIAGNOSIS — R079 Chest pain, unspecified: Secondary | ICD-10-CM | POA: Diagnosis not present

## 2015-03-18 LAB — D-DIMER, QUANTITATIVE: D-Dimer, Quant: 0.42 ug/mL-FEU (ref 0.00–0.50)

## 2015-03-18 LAB — I-STAT TROPONIN, ED
TROPONIN I, POC: 0 ng/mL (ref 0.00–0.08)
Troponin i, poc: 0 ng/mL (ref 0.00–0.08)

## 2015-03-18 LAB — COMPREHENSIVE METABOLIC PANEL
ALT: 18 U/L (ref 14–54)
ANION GAP: 6 (ref 5–15)
AST: 19 U/L (ref 15–41)
Albumin: 4.1 g/dL (ref 3.5–5.0)
Alkaline Phosphatase: 106 U/L (ref 38–126)
BUN: 19 mg/dL (ref 6–20)
CALCIUM: 9.2 mg/dL (ref 8.9–10.3)
CO2: 26 mmol/L (ref 22–32)
CREATININE: 0.76 mg/dL (ref 0.44–1.00)
Chloride: 107 mmol/L (ref 101–111)
Glucose, Bld: 117 mg/dL — ABNORMAL HIGH (ref 65–99)
Potassium: 4.1 mmol/L (ref 3.5–5.1)
SODIUM: 139 mmol/L (ref 135–145)
Total Bilirubin: 0.8 mg/dL (ref 0.3–1.2)
Total Protein: 7.1 g/dL (ref 6.5–8.1)

## 2015-03-18 LAB — CBC
HCT: 38.9 % (ref 36.0–46.0)
Hemoglobin: 12.4 g/dL (ref 12.0–15.0)
MCH: 28.5 pg (ref 26.0–34.0)
MCHC: 31.9 g/dL (ref 30.0–36.0)
MCV: 89.4 fL (ref 78.0–100.0)
PLATELETS: 308 10*3/uL (ref 150–400)
RBC: 4.35 MIL/uL (ref 3.87–5.11)
RDW: 13 % (ref 11.5–15.5)
WBC: 9.4 10*3/uL (ref 4.0–10.5)

## 2015-03-18 LAB — LIPASE, BLOOD: Lipase: 39 U/L (ref 11–51)

## 2015-03-18 MED ORDER — METHOCARBAMOL 500 MG PO TABS: 500.0000 mg | ORAL_TABLET | Freq: Two times a day (BID) | ORAL | Status: DC

## 2015-03-18 MED ORDER — OXYCODONE-ACETAMINOPHEN 5-325 MG PO TABS
1.0000 | ORAL_TABLET | Freq: Once | ORAL | Status: AC
  Administered 2015-03-18: 1 via ORAL
  Filled 2015-03-18: qty 1

## 2015-03-18 MED ORDER — METHOCARBAMOL 500 MG PO TABS
750.0000 mg | ORAL_TABLET | Freq: Once | ORAL | Status: AC
  Administered 2015-03-18: 750 mg via ORAL
  Filled 2015-03-18: qty 2

## 2015-03-18 MED ORDER — TRAMADOL HCL 50 MG PO TABS: 50.0000 mg | ORAL_TABLET | Freq: Four times a day (QID) | ORAL | Status: DC | PRN

## 2015-03-18 NOTE — ED Notes (Signed)
Pt alert and oriented x4. Respirations even and unlabored, bilateral symmetrical rise and fall of chest. Skin warm and dry. In no acute distress. Denies needs.   

## 2015-03-18 NOTE — ED Provider Notes (Signed)
Patient received in sign out from NP Olean Ree at shift change.  Briefly, 54 y.o. F with intermittent CP over the past few weeks.  She reports it got worse tonight, husband reports they were wrestling with cat to get it cleaned up and were in "awkward positions".  Patient used inahler, pain somewhat improved afterwards.  She does have hx of PTX following CAP as well as asthma.  Plan:  Work-up thus far has been reassuring.  Pain has improved somewhat with percocet, muscle relaxer was added.  Delta trop 0730.  If negative, can be d/c home.  Results for orders placed or performed during the hospital encounter of 03/18/15  Lipase, blood  Result Value Ref Range   Lipase 39 11 - 51 U/L  Comprehensive metabolic panel  Result Value Ref Range   Sodium 139 135 - 145 mmol/L   Potassium 4.1 3.5 - 5.1 mmol/L   Chloride 107 101 - 111 mmol/L   CO2 26 22 - 32 mmol/L   Glucose, Bld 117 (H) 65 - 99 mg/dL   BUN 19 6 - 20 mg/dL   Creatinine, Ser 0.76 0.44 - 1.00 mg/dL   Calcium 9.2 8.9 - 10.3 mg/dL   Total Protein 7.1 6.5 - 8.1 g/dL   Albumin 4.1 3.5 - 5.0 g/dL   AST 19 15 - 41 U/L   ALT 18 14 - 54 U/L   Alkaline Phosphatase 106 38 - 126 U/L   Total Bilirubin 0.8 0.3 - 1.2 mg/dL   GFR calc non Af Amer >60 >60 mL/min   GFR calc Af Amer >60 >60 mL/min   Anion gap 6 5 - 15  CBC  Result Value Ref Range   WBC 9.4 4.0 - 10.5 K/uL   RBC 4.35 3.87 - 5.11 MIL/uL   Hemoglobin 12.4 12.0 - 15.0 g/dL   HCT 38.9 36.0 - 46.0 %   MCV 89.4 78.0 - 100.0 fL   MCH 28.5 26.0 - 34.0 pg   MCHC 31.9 30.0 - 36.0 g/dL   RDW 13.0 11.5 - 15.5 %   Platelets 308 150 - 400 K/uL  I-stat troponin, ED (not at [facility], [facility])  Result Value Ref Range   Troponin i, poc 0.00 0.00 - 0.08 ng/mL   Comment 3          I-stat troponin, ED  Result Value Ref Range   Troponin i, poc 0.00 0.00 - 0.08 ng/mL   Comment 3           Dg Chest 2 View  03/18/2015  CLINICAL DATA:  Initial evaluation for acute shortness of breath. EXAM: CHEST  2 VIEW  COMPARISON:  Prior study from 10/11/2010. FINDINGS: The cardiac and mediastinal silhouettes are stable in size and contour, and remain within normal limits. The lungs are normally inflated. No airspace consolidation, pleural effusion, or pulmonary edema is identified. There is no pneumothorax. No acute osseous abnormality identified. IMPRESSION: No active cardiopulmonary disease. Electronically Signed   By: Jeannine Boga M.D.   On: 03/18/2015 05:30    Delta troponin has come back negative. I have discussed results with patient however she is insistent that there is something wrong in her chest.  She states she cannot understand why she has pain.  She does have some tenderness of her right chest wall, no deformities noted.  States pain has improved with meds but she can still feel pain. Patient has no RF for DVT-- no estrogen therapy, no recent travel, surgeries, trauma, prolonged immobilization, and  no hx of DVT or PE.  VSS, no tachycardia or hypoxia.  Patient states she was supposed to have a lab sent for "blood clots" as she is worried for this.  No d-dimer sent earlier.  Patient is low risk for DVT/PE, however patient is very anxious and insistent that something more be done.  Will send d-dimer, if negative patient will be discharged.  9:29 AM D-dimer negative.  Given negative work-up here today in the setting of ongoing but improving pain, low suspicion for ACS, PE, dissection, or other acute cardiac event. Her pain is reproducible with palpation and it is possible that her symptoms are musculoskeletal in nature as she does admit that her and her husband were wrestling with cat this morning. Patient d/c home with supportive care.  Her former PCP has retired, she will follow-up with other PCP in his office.  Patient has also seen cardiology in the past for MVP, instructed she may follow-up in their office as well.  Rx tramadol and robaxin.  Discussed plan with patient, he/she acknowledged  understanding and agreed with plan of care.  Return precautions given for new or worsening symptoms.  Larene Pickett, PA-C 03/18/15 [place], DO 03/19/15 [phone]

## 2015-03-18 NOTE — ED Notes (Signed)
Pt transported to Xray. 

## 2015-03-18 NOTE — Discharge Instructions (Signed)
Take the prescribed medication as directed.  This should help with pain. Follow-up with PCP-- may wish to establish care with new PCP in Dr. Clayborn Heron group. Return to the ED for new or worsening symptoms.

## 2015-03-18 NOTE — ED Notes (Addendum)
Pt complaining of R sided chest pain and pain when she breathes Pt reports hx of bronchospasms and left lung collapse. Reports no precipitating factors. Pt tearful in triage. States that it started around 1900 last night. Endorses nausea and some shortness of breath, but she used her inhaler and it helped.

## 2015-03-18 NOTE — ED Provider Notes (Signed)
CSN: [id number]     Arrival date & time 03/18/15  0406 History   First MD Initiated Contact with Patient 03/18/15 0431     Chief Complaint  Patient presents with  . Chest Pain     (Consider location/radiation/quality/duration/timing/severity/associated sxs/prior Treatment) HPI Comments: This is a 54 a female who reports to the emergency department tonight with right-sided chest pain that is worse when she takes a deep breath.  She states it is significantly better since she used her inhaler but is still uncomfortable when she leans back or is in certain positions.  She denies nausea, but endorses shortness of breath. She states over the past couple weeks.  She's had intermittent episodes of sharp pain in her right chest, but not to this extent.  She had one episode where she became dizzy and had to sit down for several minutes, but then was able to proceed with her activities.  She does have history of asthma and a spontaneous pneumothorax after a bout of pneumonia  Patient is a 54 y.o. female presenting with chest pain. The history is provided by the patient.  Chest Pain Pain location:  R chest Pain quality: sharp and stabbing   Pain radiates to:  R shoulder Pain radiates to the back: no   Pain severity:  Mild Onset quality:  Sudden Timing:  Constant Progression:  Improving Chronicity:  New Context: breathing and at rest   Relieved by:  Nothing Worsened by:  Coughing, movement, deep breathing and certain positions Ineffective treatments:  Leaning forward Associated symptoms: abdominal pain and shortness of breath   Associated symptoms: no cough, no dizziness, no fever and no nausea     Past Medical History  Diagnosis Date  . Migraine   . MVP (mitral valve prolapse)   . Hypothyroidism   . Asthma     post infection RAD   Past Surgical History  Procedure Laterality Date  . Breast lumpectomy  1997  . Total abdominal hysterectomy  09/26/2011    for fibroids; Dr Ronita Hipps    Family History  Problem Relation Age of Onset  . Heart attack Maternal Grandfather     > 55  . Hypertension Mother   . Hypertension Maternal Grandmother   . Diabetes Maternal Uncle   . Diabetes Maternal Grandmother   . Breast cancer Mother    Social History  Substance Use Topics  . Smoking status: Former Smoker    Types: Cigarettes    Quit date: 04/11/1978  . Smokeless tobacco: Never Used  . Alcohol Use: Yes     Comment:  3-4 drinks / week   OB History    No data available     Review of Systems  Constitutional: Negative for fever and chills.  Respiratory: Positive for shortness of breath. Negative for cough.   Cardiovascular: Positive for chest pain.  Gastrointestinal: Positive for abdominal pain. Negative for nausea.  Genitourinary: Negative for dysuria.  Neurological: Negative for dizziness.  All other systems reviewed and are negative.     Allergies  Augmentin; Erythromycin; and Latex  Home Medications   Prior to Admission medications   Medication Sig Start Date End Date Taking? Authorizing Provider  acetaminophen (TYLENOL) 500 MG tablet Take 1,000 mg by mouth every 6 (six) hours as needed for moderate pain.   Yes Historical Provider, MD  albuterol (PROVENTIL HFA;VENTOLIN HFA) 108 (90 BASE) MCG/ACT inhaler 1-2 puffs every 4-6 hours as needed 04/14/14  Yes Irene Pap, NP  Fluticasone-Salmeterol (ADVAIR DISKUS) 250-50  MCG/DOSE AEPB Inhale 1 puff into the lungs 2 (two) times daily. --- No further refills until office visit is scheduled. 01/02/15  Yes Hendricks Limes, MD  ibuprofen (ADVIL,MOTRIN) 200 MG tablet Take 800 mg by mouth every 6 (six) hours as needed for mild pain.   Yes Historical Provider, MD  levothyroxine (SYNTHROID, LEVOTHROID) 50 MCG tablet Take 50 mcg by mouth daily.   Yes Historical Provider, MD  Ascorbic Acid (VITAMIN C PO) Take 2 tablets by mouth daily. Chewable 240mg  each tablet.    Historical Provider, MD  Biotin 5000 MCG CAPS Take 1 capsule  by mouth daily.    Historical Provider, MD  Calcium-Phosphorus-Vitamin D (CALCIUM GUMMIES PO) Take 2 capsules by mouth daily.    Historical Provider, MD  fluticasone (FLONASE) 50 MCG/ACT nasal spray 1 spray in each nostril twice a day as needed. Use the "crossover" technique as discussed Patient not taking: Reported on 04/14/2014 04/25/12   Hendricks Limes, MD  Multiple Vitamin (MULITIVITAMIN WITH MINERALS) TABS Take 2 tablets by mouth daily.    Historical Provider, MD  predniSONE (DELTASONE) 10 MG tablet Take 4Tpo qam X 3d, then 3T po qam X 3d, then 2T po qd X 3d, then 1T po qam X 3d. 04/14/14   Irene Pap, NP  vitamin B-12 (CYANOCOBALAMIN) 500 MCG tablet Take 1,000 mcg by mouth daily.    Historical Provider, MD   BP 157/103 mmHg  Pulse 82  Temp(Src) 98 F (36.7 C) (Oral)  Resp 20  Ht 5\' 5"  (1.651 m)  Wt 68.04 kg  BMI 24.96 kg/m2  SpO2 98%  LMP 09/12/2011 Physical Exam  Constitutional: She appears well-developed and well-nourished.  HENT:  Head: Normocephalic.  Eyes: Pupils are equal, round, and reactive to light.  Neck: Normal range of motion.  Cardiovascular: Normal rate and regular rhythm.   Murmur heard. History of mitral valve prolapse  Pulmonary/Chest: Effort normal.  Abdominal: Soft. She exhibits no distension. There is no tenderness.  Musculoskeletal: Normal range of motion.  Neurological: She is alert.  Skin: Skin is warm and dry.    ED Course  Procedures (including critical care time) Labs Review Labs Reviewed  LIPASE, BLOOD  COMPREHENSIVE METABOLIC PANEL  CBC  I-STAT [place], ED    Imaging Review No results found. I have personally reviewed and evaluated these images and lab results as part of my medical decision-making.   EKG Interpretation   Date/Time:  Wednesday March 18 2015 04:10:22 EST Ventricular Rate:  76 PR Interval:  143 QRS Duration: 85 QT Interval:  380 QTC Calculation: 427 R Axis:   38 Text Interpretation:  Sinus rhythm Low  voltage, precordial leads No  significant change since last tracing in 2009 Confirmed by WARD,  DO,  KRISTEN [phone]) on 03/18/2015 4:34:01 AM     Reassessment pain is subsiding but still having some spasms husband informs me at this time that they were wrestling a cat to get it cleaned and in some peculiar positions and then the pain started immediately after that, so I will give her a muscle relaxer to see if this does help the discomfort.  She is also understands that she is waiting for her second troponin at 7:30 and they are in agreement to this. MDM   Final diagnoses:  None         Junius Creamer, NP 03/18/15 [place], DO 03/18/15 [id number]

## 2015-03-18 NOTE — ED Notes (Signed)
I Stat Troponin Result - 0.00

## 2015-03-19 ENCOUNTER — Ambulatory Visit: Payer: 59 | Admitting: Family

## 2015-03-19 ENCOUNTER — Encounter: Payer: Self-pay | Admitting: Internal Medicine

## 2015-03-19 ENCOUNTER — Ambulatory Visit (INDEPENDENT_AMBULATORY_CARE_PROVIDER_SITE_OTHER): Payer: 59 | Admitting: Internal Medicine

## 2015-03-19 ENCOUNTER — Telehealth: Payer: Self-pay | Admitting: Internal Medicine

## 2015-03-19 VITALS — BP 152/98 | HR 76 | Temp 98.0°F | Resp 16 | Wt 161.0 lb

## 2015-03-19 DIAGNOSIS — Z23 Encounter for immunization: Secondary | ICD-10-CM

## 2015-03-19 DIAGNOSIS — J930 Spontaneous tension pneumothorax: Secondary | ICD-10-CM | POA: Diagnosis not present

## 2015-03-19 DIAGNOSIS — K589 Irritable bowel syndrome without diarrhea: Secondary | ICD-10-CM

## 2015-03-19 DIAGNOSIS — M940 Chondrocostal junction syndrome [Tietze]: Secondary | ICD-10-CM | POA: Diagnosis not present

## 2015-03-19 DIAGNOSIS — J45909 Unspecified asthma, uncomplicated: Secondary | ICD-10-CM | POA: Insufficient documentation

## 2015-03-19 DIAGNOSIS — J4520 Mild intermittent asthma, uncomplicated: Secondary | ICD-10-CM

## 2015-03-19 MED ORDER — FLUTICASONE-SALMETEROL 250-50 MCG/DOSE IN AEPB: 1.0000 | INHALATION_SPRAY | Freq: Two times a day (BID) | RESPIRATORY_TRACT | Status: DC

## 2015-03-19 MED ORDER — FLUTICASONE-SALMETEROL 250-50 MCG/DOSE IN AEPB: INHALATION_SPRAY | RESPIRATORY_TRACT | Status: DC

## 2015-03-19 NOTE — Progress Notes (Signed)
Patient received education resource, including the self-management goal and tool. Patient verbalized understanding. 

## 2015-03-19 NOTE — Telephone Encounter (Signed)
Patient Name: Elizabeth Roth DOB: 01/29/1961 Initial Comment Caller states she went to the ER yesterday. Chest pains. DX- found nothing. Right side under her breast, and goes to her back, she thinks it may be her gallbladder. Nurse Assessment Nurse: Vallery Sa, RN, Cathy Date/Time (Eastern Time): 03/19/2015 8:53:05 AM Confirm and document reason for call. If symptomatic, describe symptoms. ---Caller states she was seen in the ER yesterday for right chest pain that started about 3 days ago. She was advised to see her MD today for gallbladder concerns. She is not having severe breathing difficulty, but she has a sharp pain when breathing in. No fever. Has the patient traveled out of the country within the last 30 days? ---No Does the patient have any new or worsening symptoms? ---Yes Will a triage be completed? ---Yes Related visit to physician within the last 2 weeks? ---Yes Does the PT have any chronic conditions? (i.e. diabetes, asthma, etc.) ---Yes List chronic conditions. ---Thyroid problems, Asthma Did the patient indicate they were pregnant? ---No Is this a behavioral health or substance abuse call? ---No Guidelines Guideline Title Affirmed Question Affirmed Notes Chest Pain [1] Chest pain lasts > 5 minutes AND [2] described as crushing, pressure-like, or heavy Final Disposition User Call EMS 911 Now [place], RN, [employer] declined the Call 911 disposition. Reinforced the Call 911 disposition. Caller states they have been to the ER and want to see MD at the office. Called the office backline and Colletta [place] will give further direction from MD. Referrals GO TO FACILITY REFUSED Disagree/Comply: Disagree Disagree/Comply Reason: Disagree with instructions

## 2015-03-19 NOTE — Progress Notes (Signed)
   Subjective:    Patient ID: Elizabeth Roth, female    DOB: 1960/12/15, 54 y.o.   MRN: [id number]  HPI She was seen in emergency room 03/18/15 with atypical chest pain manifested as sharp pain in the right inframammary area with radiation posteriorly with respirations.  She had felt lightheaded 12/5. At approximately 10 PM 12/6 she felt pressure in her chest and used albuterol without relief.  At 1 AM 12/7 the pain was more intense and did not respond to 2 Advil. By 3:30 AM she described it as severe and stabbing prompting the ER visit.  She in kickboxing but has not been engaged in this activity for at least 2 weeks. On one occasion she and her husband were trying to control a pet cat for care needs and this did involve awkward positions. She does not believe that there was a relationship of the chest pain to this incident.  She was concerned because of history previously of pneumothorax following community-acquired pneumonia in 1980. She also has history of asthma and has Rxs for both a rescue agent and Advair.   The emergency room records were reviewed including history, labs, and x-rays. The chest film revealed no active disease. Troponin was 0.002. D-dimer was 0.42. As recorded there was no history of any trigger or predisposition to suggest deep venous thrombosis or pulmonary thromboemboli. The d-dimer was drawn because of the patient's concern of an undiagnosed process.  She's also had intermittent loose stool and occasionally explosive diarrheal stools.  Reflux is minor  Review of Systems   There is no significant cough, sputum production,hemoptysis, wheezing,or  paroxysmal nocturnal dyspnea. Unexplained weight loss, abdominal pain, significant dyspepsia, dysphagia, melena, rectal bleeding, or persistently small caliber stools are not present. Dysuria, pyuria, hematuria, frequency, nocturia or polyuria are denied. Frontal headache, facial pain , nasal purulence, dental pain, sore  throat , otic pain or otic discharge denied. No fever , chills or sweats.     Objective:   Physical Exam  Pertinent or positive findings include: She has classic costochondritis parasternally, particularly on the right. She has minor crepitus the knees. Homans sign is negative.  General appearance :adequately nourished; in no distress.  Eyes: No conjunctival inflammation or scleral icterus is present.  Oral exam:  Lips and gums are healthy appearing.There is no oropharyngeal erythema or exudate noted. Dental hygiene is good.  Heart:  Normal rate and regular rhythm. S1 and S2 normal without gallop, murmur, click, rub or other extra sounds    Lungs:Chest clear to auscultation; no wheezes, rhonchi,rales ,or rubs present.No increased work of breathing.   Abdomen: bowel sounds normal, soft and non-tender without masses, organomegaly or hernias noted.  No guarding or rebound.   Vascular : all pulses equal ; no bruits present.  Skin:Warm & dry.  Intact without suspicious lesions or rashes ; no tenting or jaundice   Lymphatic: No lymphadenopathy is noted about the head, neck, axilla.   Neuro: Strength, tone & DTRs normal.     Assessment & Plan:  #1 costochondritis  #2 IBS  See AVS

## 2015-03-19 NOTE — Patient Instructions (Signed)
For the chest wall pain (costochondritis) apply an anti-inflammatory cream such as Zostrix or Aspercreme twice a day or warm compress. Do not apply ice. Tramadol  every 8-12 hours as needed.  Consider glucosamine sulfate 1500 mg daily for joint symptoms. Take this daily  for 2-4 weeks.This will rehydrate the cartilages. A new report indicates that glucosamine/chondroitin is is effective for arthritic pain as is Celebrex. The only contraindication to this supplement would be a shellfish allergy.  Please take a probiotic , Florastor OR Align, every day if the bowels are loose. This will replace the normal bacteria which  are necessary for formation of normal stool and processing of food.

## 2015-03-19 NOTE — Telephone Encounter (Signed)
[employer] called  Purcell Nails (pt spouse) - spouse and pt wants to see PCP. Appt is scheduled with Terri Piedra.  Pt is having pain rt side thoracic pain (under breast) that radiates to the posterior. Painful for pt to inhale. No recent injury or muscle strain.  Pain started on Sunday and gradually progressed with pressure. By Tuesday night pt was very uncomfortable. 600mg  tylenol was taken with no relief.   Spouse mentions gallbladder problem as the suspected issue.  Asked spouse about the foods that were recently eaten: foods listed were filled with some fats.  Pt is currently using heating pad, chamomile tea, tramadol and muscle relaxer.   ER did xray, ECG and blood work.

## 2015-03-19 NOTE — Telephone Encounter (Signed)
appt has been changed 

## 2015-03-23 ENCOUNTER — Ambulatory Visit: Payer: 59 | Admitting: Internal Medicine

## 2017-07-14 ENCOUNTER — Emergency Department (HOSPITAL_BASED_OUTPATIENT_CLINIC_OR_DEPARTMENT_OTHER)
Admission: EM | Admit: 2017-07-14 | Discharge: 2017-07-14 | Disposition: A | Discharge status: Home / Self Care | Payer: 59 | Attending: Emergency Medicine | Admitting: Emergency Medicine

## 2017-07-14 ENCOUNTER — Other Ambulatory Visit: Payer: Self-pay

## 2017-07-14 ENCOUNTER — Emergency Department (HOSPITAL_BASED_OUTPATIENT_CLINIC_OR_DEPARTMENT_OTHER): Payer: 59

## 2017-07-14 ENCOUNTER — Encounter (HOSPITAL_BASED_OUTPATIENT_CLINIC_OR_DEPARTMENT_OTHER): Payer: Self-pay | Admitting: *Deleted

## 2017-07-14 DIAGNOSIS — R079 Chest pain, unspecified: Secondary | ICD-10-CM | POA: Diagnosis not present

## 2017-07-14 DIAGNOSIS — Z91040 Latex allergy status: Secondary | ICD-10-CM | POA: Diagnosis not present

## 2017-07-14 DIAGNOSIS — I10 Essential (primary) hypertension: Secondary | ICD-10-CM | POA: Insufficient documentation

## 2017-07-14 DIAGNOSIS — E039 Hypothyroidism, unspecified: Secondary | ICD-10-CM | POA: Diagnosis not present

## 2017-07-14 DIAGNOSIS — J45909 Unspecified asthma, uncomplicated: Secondary | ICD-10-CM | POA: Insufficient documentation

## 2017-07-14 DIAGNOSIS — M549 Dorsalgia, unspecified: Secondary | ICD-10-CM | POA: Insufficient documentation

## 2017-07-14 DIAGNOSIS — Z79899 Other long term (current) drug therapy: Secondary | ICD-10-CM | POA: Diagnosis not present

## 2017-07-14 DIAGNOSIS — R1031 Right lower quadrant pain: Secondary | ICD-10-CM | POA: Insufficient documentation

## 2017-07-14 DIAGNOSIS — Z87891 Personal history of nicotine dependence: Secondary | ICD-10-CM | POA: Diagnosis not present

## 2017-07-14 DIAGNOSIS — R110 Nausea: Secondary | ICD-10-CM | POA: Insufficient documentation

## 2017-07-14 DIAGNOSIS — R531 Weakness: Secondary | ICD-10-CM | POA: Diagnosis not present

## 2017-07-14 DIAGNOSIS — R0789 Other chest pain: Secondary | ICD-10-CM | POA: Diagnosis not present

## 2017-07-14 LAB — BASIC METABOLIC PANEL
ANION GAP: 7 (ref 5–15)
BUN: 13 mg/dL (ref 6–20)
CHLORIDE: 106 mmol/L (ref 101–111)
CO2: 27 mmol/L (ref 22–32)
Calcium: 9.2 mg/dL (ref 8.9–10.3)
Creatinine, Ser: 0.73 mg/dL (ref 0.44–1.00)
GFR calc Af Amer: >60 mL/min (ref >60)
GLUCOSE: 88 mg/dL (ref 65–99)
POTASSIUM: 4 mmol/L (ref 3.5–5.1)
SODIUM: 140 mmol/L (ref 135–145)

## 2017-07-14 LAB — CBC
HCT: 37.4 % (ref 36.0–46.0)
HEMOGLOBIN: 12.4 g/dL (ref 12.0–15.0)
MCH: 29.7 pg (ref 26.0–34.0)
MCHC: 33.2 g/dL (ref 30.0–36.0)
MCV: 89.5 fL (ref 78.0–100.0)
Platelets: 306 10*3/uL (ref 150–400)
RBC: 4.18 MIL/uL (ref 3.87–5.11)
RDW: 12.2 % (ref 11.5–15.5)
WBC: 9 10*3/uL (ref 4.0–10.5)

## 2017-07-14 LAB — TROPONIN I

## 2017-07-14 LAB — D-DIMER, QUANTITATIVE: D-Dimer, Quant: 0.41 ug/mL-FEU (ref 0.00–0.50)

## 2017-07-14 MED ORDER — IBUPROFEN 600 MG PO TABS: 600.0000 mg | ORAL_TABLET | Freq: Four times a day (QID) | ORAL | 0 refills | Status: AC | PRN

## 2017-07-14 MED ORDER — ONDANSETRON 4 MG PO TBDP
4.0000 mg | ORAL_TABLET | Freq: Once | ORAL | Status: AC
  Administered 2017-07-14: 4 mg via ORAL
  Filled 2017-07-14: qty 1

## 2017-07-14 MED ORDER — ASPIRIN 81 MG PO CHEW
324.0000 mg | CHEWABLE_TABLET | Freq: Once | ORAL | Status: AC
  Administered 2017-07-14: 324 mg via ORAL
  Filled 2017-07-14: qty 4

## 2017-07-14 MED ORDER — GI COCKTAIL ~~LOC~~
30.0000 mL | Freq: Once | ORAL | Status: AC
  Administered 2017-07-14: 30 mL via ORAL
  Filled 2017-07-14: qty 30

## 2017-07-14 NOTE — Discharge Instructions (Addendum)
You may take anti-inflammatories for the pain in her chest.  You are given a prescription for ibuprofen which he can take every 6 hours for your pain.  Please follow-up with your cardiologist for reevaluation.  Please also follow-up with primary care within 1 week for reevaluation.  You were noted to have high blood pressure today during your visit in the emergency department. You will need to follow up with your primary healthcare provider to have your blood pressure rechecked as you may need to be started on medication for this if it remains elevated. If you experience any chest pain, shortness of breath, headaches, vision changes, numbness, weakness, lightheadedness, changes in mental status, or decrease in urination you should return to the emergency department immediately.

## 2017-07-14 NOTE — ED Provider Notes (Signed)
[facility] EMERGENCY DEPARTMENT Provider Note   CSN: [id number] Arrival date & time: 07/14/17  1512     History   Chief Complaint Chief Complaint  Patient presents with  . Chest Pain    HPI Elizabeth Roth is a 57 y.o. female.  HPI   Patient is a 15-year-old female presents the ED today complaining of chest pressure that began when she woke up this morning around 8:30 AM. Denies that pain woke her out of her sleep. Describes pressure as 5/10 dull.  Located to mid and right side of chest.  She intermittently has pain that radiates up the right side of her neck and into her right arm. Denies that symptoms are associated with exertion, movement, or certain positions. Also reports that she had pain to her neck and midline upper back when she woke up this morning, however this has resolved on its own.  Also complains of pain to right groin area began when she woke up this morning.  She has a history of asthma and chronic shortness of breath, states shortness of breath is at baseline today and not worse.  Reports associated nausea today, denies any diaphoresis or vomiting.  Denies any lightheadedness, syncope, dizziness, numbness to the bilateral upper or lower extremities.  Does state feel that she has weakness to bilateral lower extremities, however has been ambulatory without difficulty today. No unilateral weakness or numbness. Denies any abdominal pain or urinary symptoms.  She has not taken anything to help with her symptoms.  Has history of costochondritis but states this feels different.    Denies leg pain/swelling, hemoptysis, recent surgery/trauma, recent long travel, hormone use, personal hx of cancer, or hx of DVT/PE.  States she used to smoke tobacco, however no longer does and quit greater than 10 years ago.  Has history of hypothyroidism and is on Synthroid.  States had levels checked recently and all were normal.  She was recently diagnosed with HLD, but has not started  on medications that she has not established with primary care currently.  Past Medical History:  Diagnosis Date  . Asthma    post infection RAD  . Hx of pneumothorax   . Hypothyroidism   . Migraine   . MVP (mitral valve prolapse)     Patient Active Problem List   Diagnosis Date Noted  . Reactive airway disease 03/19/2015  . HYPOTHYROIDISM 09/10/2009  . HEADACHE 09/10/2009  . SPONTANEOUS PNEUMOTHORAX 03/20/2008    Past Surgical History:  Procedure Laterality Date  . BREAST LUMPECTOMY  1997  . TOTAL ABDOMINAL HYSTERECTOMY  09/26/2011   for fibroids; Dr Ronita Hipps     OB History   None      Home Medications    Prior to Admission medications   Medication Sig Start Date End Date Taking? Authorizing Provider  acetaminophen (TYLENOL) 500 MG tablet Take 1,000 mg by mouth every 6 (six) hours as needed for moderate pain.   Yes [provider]  albuterol (PROVENTIL HFA;VENTOLIN HFA) 108 (90 BASE) MCG/ACT inhaler 1-2 puffs every 4-6 hours as needed 04/14/14  Yes Weaver, Layne C, NP  fluticasone (FLONASE) 50 MCG/ACT nasal spray 1 spray in each nostril twice a day as needed. Use the "crossover" technique as discussed 04/25/12  Yes Hopper, Darrick Penna, MD  Fluticasone Furoate-Vilanterol (BREO ELLIPTA IN) Inhale into the lungs.   Yes [provider]  levothyroxine (SYNTHROID, LEVOTHROID) 50 MCG tablet Take 50 mcg by mouth daily.   Yes [provider]  Fluticasone-Salmeterol (ADVAIR DISKUS) 250-50 MCG/DOSE AEPB Inhale 1 puff into the lungs 2 (two) times daily. 03/19/15   Hendricks Limes, MD  ibuprofen (ADVIL,MOTRIN) 600 MG tablet Take 1 tablet (600 mg total) by mouth every 6 (six) hours as needed. 07/14/17   Jashley Yellin S, PA-C  methocarbamol (ROBAXIN) 500 MG tablet Take 1 tablet (500 mg total) by mouth 2 (two) times daily. 03/18/15   Larene Pickett, PA-C  traMADol (ULTRAM) 50 MG tablet Take 1 tablet (50 mg total) by mouth every 6 (six) hours as needed. 03/18/15    Larene Pickett, PA-C    Family History Family History  Problem Relation Age of Onset  . Heart attack Maternal Grandfather        > 55  . Hypertension Mother   . Breast cancer Mother   . Hypertension Maternal Grandmother   . Diabetes Maternal Grandmother   . Diabetes Maternal Uncle     Social History Social History   Tobacco Use  . Smoking status: Former Smoker    Types: Cigarettes    Last attempt to quit: 04/11/1978    Years since quitting: 39.2  . Smokeless tobacco: Never Used  Substance Use Topics  . Alcohol use: Yes    Comment:  3-4 drinks / week  . Drug use: No     Allergies   Augmentin [amoxicillin-pot clavulanate]; Erythromycin; and Latex   Review of Systems Review of Systems  Constitutional: Negative for chills and fever.  HENT: Negative for ear pain and sore throat.   Eyes: Negative for visual disturbance.  Respiratory: Negative for cough and shortness of breath.   Cardiovascular: Positive for chest pain. Negative for palpitations and leg swelling.  Gastrointestinal: Positive for nausea. Negative for abdominal pain, blood in stool, constipation, diarrhea and vomiting.  Genitourinary: Negative for dysuria, flank pain, hematuria and urgency.  Musculoskeletal: Positive for back pain and neck pain (resolved). Gait problem: resolved.       Right groin pain  Skin: Negative for color change and rash.  Neurological: Positive for weakness. Negative for dizziness, syncope, light-headedness, numbness and headaches.  All other systems reviewed and are negative.    Physical Exam Updated Vital Signs BP (!) 142/79   Pulse (!) 59   Temp 98.2 F (36.8 C) (Oral)   Resp 12   Ht 5' 4.5" (1.638 m)   Wt 72.6 kg (160 lb)   LMP 09/12/2011   SpO2 100%   BMI 27.04 kg/m   Physical Exam  Constitutional: She is oriented to person, place, and time. She appears well-developed and well-nourished.  Non-toxic appearance. She does not appear ill. No distress.  HENT:  Head:  Normocephalic and atraumatic.  Eyes: Conjunctivae are normal.  Neck: Normal range of motion. Neck supple.  No carotid bruits bilat  Cardiovascular: Normal rate, regular rhythm, intact distal pulses and normal pulses.  No murmur heard. Pulmonary/Chest: Effort normal and breath sounds normal. No tachypnea. No respiratory distress. She has no decreased breath sounds. She has no wheezes. She has no rhonchi.  Midline and right sided chest wall ttp  Abdominal: Soft. Bowel sounds are normal. She exhibits no distension. There is no tenderness. There is no guarding.  Musculoskeletal: She exhibits no edema.       Right lower leg: Normal. She exhibits no tenderness and no edema.       Left lower leg: Normal. She exhibits no tenderness and no edema.  No midline ttp. No paraspinsous ttp  Neurological: She is alert  and oriented to person, place, and time.  Skin: Skin is warm and dry. Capillary refill takes less than 2 seconds.  Psychiatric: She has a normal mood and affect.  Nursing note and vitals reviewed.    ED Treatments / Results  Labs (all labs ordered are listed, but only abnormal results are displayed) Labs Reviewed  CBC  BASIC METABOLIC PANEL  TROPONIN I  D-DIMER, QUANTITATIVE (NOT AT [facility])  TROPONIN I    EKG EKG Interpretation  Date/Time:  Friday July 14 2017 15:19:51 EDT Ventricular Rate:  62 PR Interval:  156 QRS Duration: 82 QT Interval:  392 QTC Calculation: 397 R Axis:   29 Text Interpretation:  Normal sinus rhythm Normal ECG unchanged from prior 12/16 Confirmed by Aletta Edouard [phone]) on 07/14/2017 3:31:45 PM   Radiology Dg Chest 2 View  Result Date: 07/14/2017 CLINICAL DATA:  Acute onset chest pain and tightness since this am. Nausea. No fever. asthmatic. Ex smoker. COPD. EXAM: CHEST - 2 VIEW COMPARISON:  03/18/2015 FINDINGS: Normal heart, mediastinum and hila. Lungs are clear.  No pleural effusion or pneumothorax. Skeletal structures are unremarkable. IMPRESSION:  1. Normal chest radiographs. Electronically Signed   By: Lajean Manes M.D.   On: 07/14/2017 16:17    Procedures Procedures (including critical care time)  Medications Ordered in ED Medications  aspirin chewable tablet 324 mg (324 mg Oral Given 07/14/17 1609)  ondansetron (ZOFRAN-ODT) disintegrating tablet 4 mg (4 mg Oral Given 07/14/17 1610)  gi cocktail (Maalox,Lidocaine,Donnatal) (30 mLs Oral Given 07/14/17 1612)     Initial Impression / Assessment and Plan / ED Course  I have reviewed the triage vital signs and the nursing notes.  Pertinent labs & imaging results that were available during my care of the patient were reviewed by me and considered in my medical decision making (see chart for details).  Clinical Course as of Jul 15 2022  Fri Jul 14, 2624  533 58 year old female with no prior coronary disease who woke up this morning with some scapular back pain.  Later today she developed chest pressure and discomfort that radiated into her right arm.  She also had some vague right upper thigh discomfort that is resolved.  She is never had these symptoms before.  She states for the last month she has been trouble with more heartburn than normal.  She had a bad episode of it on Tuesday.  She is nontoxic-appearing normal vital signs normal saturation.  She is benign physical exam.  Her initial EKG is unremarkable.  She getting labs chest x-ray.  Likely will need a delta troponin and follow-up if her workup is unremarkable.   [MB]    Clinical Course User Index [MB] Hayden Rasmussen, MD    Per nursing note at 5:18PM pt states she is feeling better. She dneies chest pressure and states pain is 0/10.   On re-eval states pain has improved.   Final Clinical Impressions(s) / ED Diagnoses   Final diagnoses:  Chest pain, unspecified type  Hypertension, unspecified type  Atypical chest pain    Patient is to be discharged with recommendation to follow up with PCP and cardiologist in regards  to today's hospital visit. Chest pain is not likely of cardiac or pulmonary etiology d/t presentation, PERC negative, VSS, no tracheal deviation, no JVD or new murmur, RRR, breath sounds equal bilaterally, EKG without acute abnormalities, negative troponin, and negative CXR. No ptx, pna, or widened mediastinum. HEART score 2. Pt has been advised to return to  the ED if CP becomes exertional, associated with diaphoresis or nausea, radiates to left jaw/arm, worsens or becomes concerning in any way. Vital signs with initially elevated BP today, improved after administration of meds in ED. Pain improved in ED. Pt advised to f/u with PCP about initial documented high BP. Do not suspect HTN urgency/emergnecy. Gave Rx for antiinflammatories for pain. Discussed return precaution signs/symptoms for hypertensive emergency as listed above with the patient. She and her husband confirmed understanding.    Case has been discussed with and seen by Dr. Melina Copa who agrees with the above plan to discharge.    ED Discharge Orders        Ordered    ibuprofen (ADVIL,MOTRIN) 600 MG tablet  Every 6 hours PRN     07/14/17 2019       Rodney Booze, PA-C 07/14/17 2024    Hayden Rasmussen, MD 07/15/17 1123

## 2017-07-14 NOTE — ED Triage Notes (Signed)
She woke this am with pain in the back of her neck and upper back. Chest pain in her right chest, arm and groin this am. States 3 days ago she had severe heart burn that was unrelieved with tums.

## 2017-07-14 NOTE — ED Notes (Signed)
Patient states feeling better; denies chest pressure and states chest pain is 0/2; patient up the use restroom without difficulty. NAD noted.

## 2017-10-06 ENCOUNTER — Ambulatory Visit: Payer: 59 | Admitting: Pulmonary Disease

## 2017-10-06 ENCOUNTER — Other Ambulatory Visit (INDEPENDENT_AMBULATORY_CARE_PROVIDER_SITE_OTHER): Payer: 59

## 2017-10-06 ENCOUNTER — Encounter: Payer: Self-pay | Admitting: Pulmonary Disease

## 2017-10-06 VITALS — BP 130/80 | HR 77 | Ht 65.0 in | Wt 163.0 lb

## 2017-10-06 DIAGNOSIS — J45909 Unspecified asthma, uncomplicated: Secondary | ICD-10-CM

## 2017-10-06 DIAGNOSIS — R059 Cough, unspecified: Secondary | ICD-10-CM

## 2017-10-06 DIAGNOSIS — R05 Cough: Secondary | ICD-10-CM | POA: Diagnosis not present

## 2017-10-06 LAB — CBC WITH DIFFERENTIAL/PLATELET
Basophils Absolute: 0.1 10*3/uL (ref 0.0–0.1)
Basophils Relative: 0.9 % (ref 0.0–3.0)
EOS PCT: 1.7 % (ref 0.0–5.0)
Eosinophils Absolute: 0.1 10*3/uL (ref 0.0–0.7)
HCT: 38.9 % (ref 36.0–46.0)
Hemoglobin: 13 g/dL (ref 12.0–15.0)
LYMPHS ABS: 2.4 10*3/uL (ref 0.7–4.0)
Lymphocytes Relative: 28.9 % (ref 12.0–46.0)
MCHC: 33.4 g/dL (ref 30.0–36.0)
MCV: 88.2 fl (ref 78.0–100.0)
MONO ABS: 0.5 10*3/uL (ref 0.1–1.0)
Monocytes Relative: 5.6 % (ref 3.0–12.0)
NEUTROS PCT: 62.9 % (ref 43.0–77.0)
Neutro Abs: 5.2 10*3/uL (ref 1.4–7.7)
PLATELETS: 339 10*3/uL (ref 150.0–400.0)
RBC: 4.4 Mil/uL (ref 3.87–5.11)
RDW: 13.7 % (ref 11.5–15.5)
WBC: 8.3 10*3/uL (ref 4.0–10.5)

## 2017-10-06 LAB — POCT EXHALED NITRIC OXIDE: FENO LEVEL (PPB): 8

## 2017-10-06 MED ORDER — OMEPRAZOLE 40 MG PO CPDR: 40.0000 mg | DELAYED_RELEASE_CAPSULE | Freq: Every day | ORAL | 2 refills | Status: AC

## 2017-10-06 MED ORDER — FLUTICASONE FUROATE-VILANTEROL 200-25 MCG/INH IN AEPB: 1.0000 | INHALATION_SPRAY | Freq: Every day | RESPIRATORY_TRACT | 5 refills | Status: DC

## 2017-10-06 NOTE — Patient Instructions (Signed)
We will renew your Breo 200.  Continue albuterol as needed Reassess your asthma with CBC differential, pulmonary function tests  In addition we will treat your postnasal drip with chlorpheniramine 8 mg 3 times daily and acid reflux with Prilosec 40 mg a day Continue Flonase Reassess in 1 to 2 months.

## 2017-10-06 NOTE — Progress Notes (Signed)
COREENA RUBALCAVA    [id number]    1960-07-29  Primary Care Physician:Taavon, Delfino Lovett, MD  Referring Physician: No referring provider defined for this encounter.  Chief complaint:  Consult for asthma  HPI: 57 year old with diagnosis of asthma, recurrent bronchitis, left pneumothorax.  Previously followed by Dr. Jodi Mourning She has not followed up in 10 years.  Continues on Breo inhaler which she ran off a few weeks ago and switch to Advair She has been seen several times in the urgent care for bronchitis over the past few years.  Reports increasing symptoms of dyspnea with exertion, occasional symptoms at rest, nonproductive cough with wheeze, occasional mucus production.  She describes episodes of throat tightening and difficulty breathing with stridor and thinks he may have vocal cord dysfunction She has significant issues with seasonal allergies, acid reflux, postnasal drip.  History noted for spontaneous left pneumothorax more than 10 years ago.  This was treated conservatively.  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 Exposures: No known exposures, no mold, dampness, hot tub, Jacuzzi Smoking history: Social smoker in 1980s as a teenager Travel history: Originally from [place].  No significant travel  Outpatient Encounter Medications as of 10/06/2017  Medication Sig  . acetaminophen (TYLENOL) 500 MG tablet Take 1,000 mg by mouth every 6 (six) hours as needed for moderate pain.  Marland Kitchen albuterol (PROVENTIL HFA;VENTOLIN HFA) 108 (90 BASE) MCG/ACT inhaler 1-2 puffs every 4-6 hours as needed  . fluticasone (FLONASE) 50 MCG/ACT nasal spray 1 spray in each nostril twice a day as needed. Use the "crossover" technique as discussed  . Fluticasone Furoate-Vilanterol (BREO ELLIPTA IN) Inhale into the lungs.  . Fluticasone-Salmeterol (ADVAIR DISKUS) 250-50 MCG/DOSE AEPB  Inhale 1 puff into the lungs 2 (two) times daily.  Marland Kitchen ibuprofen (ADVIL,MOTRIN) 600 MG tablet Take 1 tablet (600 mg total) by mouth every 6 (six) hours as needed.  Marland Kitchen levothyroxine (SYNTHROID, LEVOTHROID) 50 MCG tablet Take 50 mcg by mouth daily.  . [DISCONTINUED] methocarbamol (ROBAXIN) 500 MG tablet Take 1 tablet (500 mg total) by mouth 2 (two) times daily.  . [DISCONTINUED] traMADol (ULTRAM) 50 MG tablet Take 1 tablet (50 mg total) by mouth every 6 (six) hours as needed.   No facility-administered encounter medications on file as of 10/06/2017.     Allergies as of 10/06/2017 - Review Complete 10/06/2017  Allergen Reaction Noted  . Augmentin [amoxicillin-pot clavulanate]  04/20/2012  . Erythromycin    . Latex  09/14/2011    Past Medical History:  Diagnosis Date  . Allergy   . Asthma    post infection RAD  . Hx of pneumothorax   . Hypothyroidism   . Migraine   . MVP (mitral valve prolapse)   . Sinus trouble     Past Surgical History:  Procedure Laterality Date  . BREAST LUMPECTOMY  1997  . TOTAL ABDOMINAL HYSTERECTOMY  09/26/2011   for fibroids; Dr Ronita Hipps    Family History  Problem Relation Age of Onset  . Heart attack Maternal Grandfather        > 55  . Hypertension Mother   . Breast cancer Mother   . Hypertension Maternal Grandmother   . Diabetes Maternal Grandmother   . Diabetes Maternal Uncle     Social History   Socioeconomic History  . Marital status: Married    Spouse name: Not  on file  . Number of children: Not on file  . Years of education: Not on file  . Highest education level: Not on file  Occupational History  . Not on file  Social Needs  . Financial resource strain: Not on file  . Food insecurity:    Worry: Not on file    Inability: Not on file  . Transportation needs:    Medical: Not on file    Non-medical: Not on file  Tobacco Use  . Smoking status: Former Smoker    Types: Cigarettes    Last attempt to quit: 04/11/1978    Years since  quitting: 39.5  . Smokeless tobacco: Never Used  Substance and Sexual Activity  . Alcohol use: Yes    Comment:  3-4 drinks / week  . Drug use: No  . Sexual activity: Not on file  Lifestyle  . Physical activity:    Days per week: Not on file    Minutes per session: Not on file  . Stress: Not on file  Relationships  . Social connections:    Talks on phone: Not on file    Gets together: Not on file    Attends religious service: Not on file    Active member of club or organization: Not on file    Attends meetings of clubs or organizations: Not on file    Relationship status: Not on file  . Intimate partner violence:    Fear of current or ex partner: Not on file    Emotionally abused: Not on file    Physically abused: Not on file    Forced sexual activity: Not on file  Other Topics Concern  . Not on file  Social History Narrative  . Not on file    Review of systems: Review of Systems  Constitutional: Negative for fever and chills.  HENT: Negative.   Eyes: Negative for blurred vision.  Respiratory: as per HPI  Cardiovascular: Negative for chest pain and palpitations.  Gastrointestinal: Negative for vomiting, diarrhea, blood per rectum. Genitourinary: Negative for dysuria, urgency, frequency and hematuria.  Musculoskeletal: Negative for myalgias, back pain and joint pain.  Skin: Negative for itching and rash.  Neurological: Negative for dizziness, tremors, focal weakness, seizures and loss of consciousness.  Endo/Heme/Allergies: Negative for environmental allergies.  Psychiatric/Behavioral: Negative for depression, suicidal ideas and hallucinations.  All other systems reviewed and are negative.  Physical Exam: Blood pressure 130/80, pulse 77, height 5\' 5"  (1.651 m), weight 163 lb (73.9 kg), last menstrual period 09/12/2011, SpO2 97 %. Gen:      No acute distress HEENT:  EOMI, sclera anicteric Neck:     No masses; no thyromegaly Lungs:    Clear to auscultation bilaterally;  normal respiratory effort CV:         Regular rate and rhythm; no murmurs Abd:      + bowel sounds; soft, non-tender; no palpable masses, no distension Ext:    No edema; adequate peripheral perfusion Skin:      Warm and dry; no rash Neuro: alert and oriented x 3 Psych: normal mood and affect  Data Reviewed: Chest x-ray 07/14/2017- no acute cardiopulmonary abnormality.  I reviewed the images personally.  Assessment:  Consult for asthma Currently on Advair.  States that [facility] had worked better for her We will switch her back to [employer] with PFTs, CBC differential, blood allergy profile  May have vocal cord dysfunction as she has episodes of choking sensation in the throat Review  PFTs when available for flow loop abnormalities Consider referral to [facility] to the vocal cord dysfunction clinic She will also need good control of postnasal drip and acid reflux. We will start chlorpheniramine, continue Flonase.  Start Prilosec.  Plan/Recommendations: - Surveyor, mining, continue albuterol as needed - Check CBC differential, PFTs - Chlorpheniramine 8 mg 3 times daily, Prilosec 40 mg daily, continue Flonase  Marshell Garfinkel MD [facility] 10/06/2017, 9:20 AM  CC: No ref. provider found

## 2017-10-24 ENCOUNTER — Encounter: Payer: Self-pay | Admitting: Pulmonary Disease

## 2017-10-24 NOTE — Telephone Encounter (Signed)
Error

## 2017-12-20 ENCOUNTER — Ambulatory Visit (INDEPENDENT_AMBULATORY_CARE_PROVIDER_SITE_OTHER): Payer: 59 | Admitting: Pulmonary Disease

## 2017-12-20 ENCOUNTER — Encounter: Payer: Self-pay | Admitting: Pulmonary Disease

## 2017-12-20 VITALS — BP 126/78 | HR 61 | Ht 63.75 in | Wt 162.0 lb

## 2017-12-20 DIAGNOSIS — Z23 Encounter for immunization: Secondary | ICD-10-CM | POA: Diagnosis not present

## 2017-12-20 DIAGNOSIS — R05 Cough: Secondary | ICD-10-CM | POA: Diagnosis not present

## 2017-12-20 DIAGNOSIS — R059 Cough, unspecified: Secondary | ICD-10-CM

## 2017-12-20 DIAGNOSIS — J45909 Unspecified asthma, uncomplicated: Secondary | ICD-10-CM

## 2017-12-20 LAB — PULMONARY FUNCTION TEST
DL/VA % PRED: 101 %
DL/VA: 4.78 ml/min/mmHg/L
DLCO unc % pred: 86 %
DLCO unc: 20.18 ml/min/mmHg
FEF 25-75 POST: 3.53 L/s
FEF 25-75 Pre: 3.82 L/sec
FEF2575-%CHANGE-POST: -7 %
FEF2575-%PRED-POST: 141 %
FEF2575-%PRED-PRE: 153 %
FEV1-%Change-Post: 0 %
FEV1-%PRED-PRE: 95 %
FEV1-%Pred-Post: 95 %
FEV1-PRE: 2.47 L
FEV1-Post: 2.48 L
FEV1FVC-%CHANGE-POST: 1 %
FEV1FVC-%PRED-PRE: 113 %
FEV6-%CHANGE-POST: -1 %
FEV6-%PRED-PRE: 85 %
FEV6-%Pred-Post: 84 %
FEV6-Post: 2.72 L
FEV6-Pre: 2.76 L
FEV6FVC-%Pred-Post: 103 %
FEV6FVC-%Pred-Pre: 103 %
FVC-%Change-Post: -1 %
FVC-%PRED-POST: 82 %
FVC-%PRED-PRE: 83 %
FVC-POST: 2.72 L
FVC-Pre: 2.76 L
POST FEV1/FVC RATIO: 91 %
POST FEV6/FVC RATIO: 100 %
PRE FEV1/FVC RATIO: 90 %
Pre FEV6/FVC Ratio: 100 %
RV % pred: 119 %
RV: 2.22 L
TLC % PRED: 107 %
TLC: 5.29 L

## 2017-12-20 NOTE — Progress Notes (Signed)
Patient completed full PFT today. 

## 2017-12-20 NOTE — Patient Instructions (Addendum)
I have reviewed your lung function test which shows normal lung function. Continue inhalers as prescribed We can give you a flu shot today Follow-up in 6 months.

## 2017-12-20 NOTE — Progress Notes (Signed)
Elizabeth Roth    [id number]    1960/05/05  Primary Care Physician:Taavon, Delfino Lovett, MD  Referring Physician: Brien Few, MD [place], [place] [address]  Chief complaint:  Follow up for asthma  HPI: 57 year old with diagnosis of asthma, recurrent bronchitis, left pneumothorax.  Previously followed by Dr. Jodi Mourning She has not followed up in 10 years.  Continues on Breo inhaler which she ran off a few weeks ago and switch to Advair She has been seen several times in the urgent care for bronchitis over the past few years.  Reports increasing symptoms of dyspnea with exertion, occasional symptoms at rest, nonproductive cough with wheeze, occasional mucus production.  She describes episodes of throat tightening and difficulty breathing with stridor and thinks he may have vocal cord dysfunction She has significant issues with seasonal allergies, acid reflux, postnasal drip.  History noted for spontaneous left pneumothorax more than 10 years ago.  This was treated conservatively.  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 Exposures: No known exposures, no mold, dampness, hot tub, Jacuzzi Smoking history: Social smoker in 1980s as a teenager Travel history: Originally from [place].  No significant travel  Interim history: Here for review of PFTs.  States that breathing is doing well with no issues.  Physical Exam: Blood pressure 126/78, pulse 61, height 5' 3.75" (1.619 m), weight 162 lb (73.5 kg), last menstrual period 09/12/2011, SpO2 96 %. Gen:      No acute distress HEENT:  EOMI, sclera anicteric Neck:     No masses; no thyromegaly Lungs:    Clear to auscultation bilaterally; normal respiratory effort CV:         Regular rate and rhythm; no murmurs Abd:      + bowel sounds; soft, non-tender; no palpable masses, no distension Ext:    No  edema; adequate peripheral perfusion Skin:      Warm and dry; no rash Neuro: alert and oriented x 3 Psych: normal mood and affect  Data Reviewed: Chest x-ray 07/14/2017- no acute cardiopulmonary abnormality.  I reviewed the images personally.  PFTs 12/20/2017 FVC 2.72 [82%), FEV1 2.48 [95%), F/F 91, TLC 107%, DLCO 86% Normal test  Labs CBC 09/28/2017-WBC 8.3, eos 1.7%, absolute eosinophil count 141  Assessment:  Consult for asthma I reviewed her PFTs with no significant obstruction. I am not sure if she has significant asthma.  She may have mild reactive airway disease in setting of allergic rhinitis, postnasal drip. She may also have vocal cord dysfunction as she has episodes of choking sensation in the throat  Continue Breo for now.  Reassess if we can cut down on inhalers at next visit Continue chlorpheniramine, Flonase and Prilosec for postnasal drip and acid reflux.  Plan/Recommendations: - Surveyor, mining, continue albuterol as needed - Chlorpheniramine 8 mg 3 times daily, Prilosec 40 mg daily, continue Flonase - Flu vaccine today   Outpatient Encounter Medications as of 12/20/2017  Medication Sig  . acetaminophen (TYLENOL) 500 MG tablet Take 1,000 mg by mouth every 6 (six) hours as needed for moderate pain.  Marland Kitchen albuterol (PROVENTIL HFA;VENTOLIN HFA) 108 (90 BASE) MCG/ACT inhaler 1-2 puffs every 4-6 hours as needed  . fluticasone (FLONASE) 50 MCG/ACT nasal spray 1 spray in each nostril twice a day as needed. Use the "crossover" technique as discussed  . fluticasone furoate-vilanterol (BREO ELLIPTA)  200-25 MCG/INH AEPB Inhale 1 puff into the lungs daily.  . Fluticasone-Salmeterol (ADVAIR DISKUS) 250-50 MCG/DOSE AEPB Inhale 1 puff into the lungs 2 (two) times daily.  Marland Kitchen ibuprofen (ADVIL,MOTRIN) 600 MG tablet Take 1 tablet (600 mg total) by mouth every 6 (six) hours as needed.  Marland Kitchen levothyroxine (SYNTHROID, LEVOTHROID) 50 MCG tablet Take 50 mcg by mouth daily.  . Omega-3 Fatty Acids  (FISH OIL OMEGA-3 PO) Take 2,000 mg by mouth.  Marland Kitchen omeprazole (PRILOSEC) 40 MG capsule Take 1 capsule (40 mg total) by mouth daily.   No facility-administered encounter medications on file as of 12/20/2017.     Allergies as of 12/20/2017 - Review Complete 12/20/2017  Allergen Reaction Noted  . Augmentin [amoxicillin-pot clavulanate]  04/20/2012  . Erythromycin    . Latex  09/14/2011    Past Medical History:  Diagnosis Date  . Allergy   . Asthma    post infection RAD  . Hx of pneumothorax   . Hypothyroidism   . Migraine   . MVP (mitral valve prolapse)   . Sinus trouble     Past Surgical History:  Procedure Laterality Date  . BREAST LUMPECTOMY  1997  . TOTAL ABDOMINAL HYSTERECTOMY  09/26/2011   for fibroids; Dr Ronita Hipps    Family History  Problem Relation Age of Onset  . Heart attack Maternal Grandfather        > 55  . Hypertension Mother   . Breast cancer Mother   . Hypertension Maternal Grandmother   . Diabetes Maternal Grandmother   . Diabetes Maternal Uncle     Social History   Socioeconomic History  . Marital status: Married    Spouse name: Not on file  . Number of children: Not on file  . Years of education: Not on file  . Highest education level: Not on file  Occupational History  . Not on file  Social Needs  . Financial resource strain: Not on file  . Food insecurity:    Worry: Not on file    Inability: Not on file  . Transportation needs:    Medical: Not on file    Non-medical: Not on file  Tobacco Use  . Smoking status: Former Smoker    Types: Cigarettes    Last attempt to quit: 04/11/1978    Years since quitting: 39.7  . Smokeless tobacco: Never Used  Substance and Sexual Activity  . Alcohol use: Yes    Comment:  3-4 drinks / week  . Drug use: No  . Sexual activity: Not on file  Lifestyle  . Physical activity:    Days per week: Not on file    Minutes per session: Not on file  . Stress: Not on file  Relationships  . Social connections:     Talks on phone: Not on file    Gets together: Not on file    Attends religious service: Not on file    Active member of club or organization: Not on file    Attends meetings of clubs or organizations: Not on file    Relationship status: Not on file  . Intimate partner violence:    Fear of current or ex partner: Not on file    Emotionally abused: Not on file    Physically abused: Not on file    Forced sexual activity: Not on file  Other Topics Concern  . Not on file  Social History Narrative  . Not on file    Review of systems: Review of  Systems  Constitutional: Negative for fever and chills.  HENT: Negative.   Eyes: Negative for blurred vision.  Respiratory: as per HPI  Cardiovascular: Negative for chest pain and palpitations.  Gastrointestinal: Negative for vomiting, diarrhea, blood per rectum. Genitourinary: Negative for dysuria, urgency, frequency and hematuria.  Musculoskeletal: Negative for myalgias, back pain and joint pain.  Skin: Negative for itching and rash.  Neurological: Negative for dizziness, tremors, focal weakness, seizures and loss of consciousness.  Endo/Heme/Allergies: Negative for environmental allergies.  Psychiatric/Behavioral: Negative for depression, suicidal ideas and hallucinations.  All other systems reviewed and are negative.  Marshell Garfinkel MD [facility] 12/20/2017, 10:18 AM  CC: Brien Few, MD

## 2018-01-15 ENCOUNTER — Telehealth: Payer: Self-pay | Admitting: Pulmonary Disease

## 2018-01-15 NOTE — Telephone Encounter (Signed)
Pt has been scheduled for OV on 01/16/18 at 12:00p. Nothing further is needed.

## 2018-01-15 NOTE — Telephone Encounter (Signed)
Continue current management. She can use topical pain patches such as Salonpas.  Offer appointment to be seen this week if she wants.

## 2018-01-15 NOTE — Telephone Encounter (Signed)
Called and spoke with patient, patient states her left arm is very sore and unable to lift above her shoulder, [facility] and she was told to use ibuprofen and apply heat. Her pain radiates down her left arm. Denise numbness, able to move all her fingers, she has been taking ibuprofen for 3 weeks and would like further recommendations.  PM please advise.

## 2018-01-16 ENCOUNTER — Ambulatory Visit (INDEPENDENT_AMBULATORY_CARE_PROVIDER_SITE_OTHER): Payer: 59 | Admitting: Pulmonary Disease

## 2018-01-16 ENCOUNTER — Encounter: Payer: Self-pay | Admitting: Pulmonary Disease

## 2018-01-16 VITALS — BP 128/76 | HR 75 | Temp 97.1°F | Ht 65.0 in | Wt 161.8 lb

## 2018-01-16 DIAGNOSIS — M25512 Pain in left shoulder: Secondary | ICD-10-CM

## 2018-01-16 MED ORDER — PREDNISONE 10 MG PO TABS: ORAL_TABLET | ORAL | 0 refills | Status: DC

## 2018-01-16 NOTE — Progress Notes (Addendum)
Elizabeth Roth    [id number]    Mar 25, 1961  Primary Care Physician:Taavon, Delfino Lovett, MD  Referring Physician: Brien Few, MD [place], [place] [address]  Chief complaint:  Follow up for arm pain after flu vaccination.  HPI: 57 year old with diagnosis of asthma, recurrent bronchitis, left pneumothorax.  Previously followed by Dr. Jodi Mourning She has not followed up in 10 years.  Continues on Breo inhaler which she ran off a few weeks ago and switch to Advair She has been seen several times in the urgent care for bronchitis over the past few years.  Reports increasing symptoms of dyspnea with exertion, occasional symptoms at rest, nonproductive cough with wheeze, occasional mucus production.  She describes episodes of throat tightening and difficulty breathing with stridor and thinks he may have vocal cord dysfunction She has significant issues with seasonal allergies, acid reflux, postnasal drip.  History noted for spontaneous left pneumothorax more than 10 years ago.  This was treated conservatively.  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 Exposures: No known exposures, no mold, dampness, hot tub, Jacuzzi Smoking history: Social smoker in 1980s as a teenager Travel history: Originally from [place].  No significant travel  Interim history: Had a flu shot on left arm at last clinic visit on 12/20/2017.  Developed arm pain and shoulder pain immediately after She treated her with over-the-counter NSAIDs, Voltaren cream, hot compresses without any improvement  Has pain on left arm while lying down, unable to lift shoulder above the arm.  Outpatient Encounter Medications as of 01/16/2018  Medication Sig  . acetaminophen (TYLENOL) 500 MG tablet Take 1,000 mg by mouth every 6 (six) hours as needed for moderate pain.  Marland Kitchen albuterol (PROVENTIL  HFA;VENTOLIN HFA) 108 (90 BASE) MCG/ACT inhaler 1-2 puffs every 4-6 hours as needed  . fluticasone (FLONASE) 50 MCG/ACT nasal spray 1 spray in each nostril twice a day as needed. Use the "crossover" technique as discussed  . fluticasone furoate-vilanterol (BREO ELLIPTA) 200-25 MCG/INH AEPB Inhale 1 puff into the lungs daily.  . Fluticasone-Salmeterol (ADVAIR DISKUS) 250-50 MCG/DOSE AEPB Inhale 1 puff into the lungs 2 (two) times daily.  Marland Kitchen ibuprofen (ADVIL,MOTRIN) 600 MG tablet Take 1 tablet (600 mg total) by mouth every 6 (six) hours as needed.  Marland Kitchen levothyroxine (SYNTHROID, LEVOTHROID) 50 MCG tablet Take 50 mcg by mouth daily.  . Omega-3 Fatty Acids (FISH OIL OMEGA-3 PO) Take 2,000 mg by mouth.  Marland Kitchen omeprazole (PRILOSEC) 40 MG capsule Take 1 capsule (40 mg total) by mouth daily.   No facility-administered encounter medications on file as of 01/16/2018.    Physical Exam: Blood pressure 128/76, pulse 75, temperature (!) 97.1 F (36.2 C), temperature source Oral, height 5\' 5"  (1.651 m), weight 161 lb 12.8 oz (73.4 kg), last menstrual period 09/12/2011, SpO2 96 %. Gen:      No acute distress HEENT:  EOMI, sclera anicteric Neck:     No masses; no thyromegaly Lungs:    Clear to auscultation bilaterally; normal respiratory effort CV:         Regular rate and rhythm; no murmurs Abd:      + bowel sounds; soft, non-tender; no palpable masses, no distension Ext:    Point tenderness over shoulder joint, radiating down arm.  No swelling, erythema noted. Skin:      Warm and dry; no rash  Neuro: alert and oriented x 3 Psych: normal mood and affect  Data Reviewed: Chest x-ray 07/14/2017- no acute cardiopulmonary abnormality.  I reviewed the images personally.  PFTs 12/20/2017 FVC 2.72 [82%), FEV1 2.48 [95%), F/F 91, TLC 107%, DLCO 86% Normal test  Labs CBC 09/28/2017-WBC 8.3, eos 1.7%, absolute eosinophil count 141  Assessment:  Arm pain Suspect shoulder injury related to vaccine administration. The  pain has been persistent for close to a month, unresponsive to NSAIDs, topical treatment We will refer her to orthopedics as she may need evaluation with ultrasound, physical therapy, local cortisone injection  Consult for asthma Stable symptoms I reviewed her PFTs with no significant obstruction. Continue Breo for now.  Reassess if we can cut down on inhalers at next visit  She is requesting a pred taper to be held in reserve in case of exacerbations  Plan/Recommendations: - Orthopedic consult - Continue Breo, continue albuterol as needed  Marshell Garfinkel MD [facility] 01/16/2018, 12:11 PM  CC: Brien Few, MD  Addendum: Received note from Dr. Susa Day, orthopedics.  Office note dated 01/27/2018 Three-view x-rays of the shoulder demonstrate lateral sloping acromion.  Mild AC arthrosis and mild glenohumeral joint space narrowing.  Diagnosed with impingement syndrome and perhaps an element of adhesive capsulitis. She underwent shoulder injection Recommend anti-inflammatory medication.  If no better then will need to get MRI for further evaluation and treatment.  Marshell Garfinkel MD [facility] 02/08/2018, 9:29 AM

## 2018-01-16 NOTE — Patient Instructions (Addendum)
We will give you a prescription for prednisone 40 mg.  Reduce dose by 10 mg every 3 days.  We will make a referral to orthopedics for evaluation of arm pain I suspect you may have inflammation in the shoulder joint may need a cortisone injection in that area and physical therapy.  Follow-up as scheduled in pulmonary clinic.

## 2018-04-14 ENCOUNTER — Other Ambulatory Visit: Payer: Self-pay | Admitting: Pulmonary Disease

## 2018-07-07 ENCOUNTER — Other Ambulatory Visit: Payer: Self-pay | Admitting: Pulmonary Disease

## 2018-07-10 ENCOUNTER — Other Ambulatory Visit: Payer: Self-pay | Admitting: *Deleted

## 2018-07-10 MED ORDER — FLUTICASONE FUROATE-VILANTEROL 200-25 MCG/INH IN AEPB: 1.0000 | INHALATION_SPRAY | Freq: Every day | RESPIRATORY_TRACT | 2 refills | Status: DC

## 2018-11-10 ENCOUNTER — Other Ambulatory Visit: Payer: Self-pay | Admitting: Pulmonary Disease

## 2019-01-06 ENCOUNTER — Other Ambulatory Visit: Payer: Self-pay | Admitting: Pulmonary Disease

## 2019-03-06 ENCOUNTER — Other Ambulatory Visit: Payer: Self-pay | Admitting: Pulmonary Disease

## 2019-03-18 ENCOUNTER — Other Ambulatory Visit: Payer: Self-pay | Admitting: Pulmonary Disease

## 2019-03-25 ENCOUNTER — Telehealth: Payer: Self-pay | Admitting: Pulmonary Disease

## 2019-03-25 NOTE — Telephone Encounter (Signed)
Called and spoke to patient. Patient is concerned about running out of her medications. Patient stated that her husband just tested positive for COVID on 03/21/2019.  Patient stated she went to get tested but doesn't have her results in. Patient stated that her only symptom right now is a stuffy nose. Patient is concerned about what she should look for being that she has asthma. Asked her to quarantine and monitor self for symptoms.  Scheduled patient for a video visit. Nothing further needed at this time.

## 2019-03-26 ENCOUNTER — Telehealth (INDEPENDENT_AMBULATORY_CARE_PROVIDER_SITE_OTHER): Payer: 59 | Admitting: Pulmonary Disease

## 2019-03-26 DIAGNOSIS — R0789 Other chest pain: Secondary | ICD-10-CM

## 2019-03-26 DIAGNOSIS — Z20828 Contact with and (suspected) exposure to other viral communicable diseases: Secondary | ICD-10-CM | POA: Diagnosis not present

## 2019-03-26 DIAGNOSIS — R0981 Nasal congestion: Secondary | ICD-10-CM | POA: Diagnosis not present

## 2019-03-26 DIAGNOSIS — J45909 Unspecified asthma, uncomplicated: Secondary | ICD-10-CM | POA: Diagnosis not present

## 2019-03-26 MED ORDER — BREO ELLIPTA 200-25 MCG/INH IN AEPB: 1.0000 | INHALATION_SPRAY | Freq: Every day | RESPIRATORY_TRACT | 3 refills | Status: DC

## 2019-03-26 MED ORDER — BREO ELLIPTA 200-25 MCG/INH IN AEPB: 1.0000 | INHALATION_SPRAY | Freq: Every day | RESPIRATORY_TRACT | 3 refills | Status: AC

## 2019-03-26 NOTE — Progress Notes (Signed)
Virtual Visit via Telephone Note  I connected with Elizabeth Roth on 03/26/19 at 10:45 AM EST by telephone and verified that I am speaking with the correct person using two identifiers.  Location: Patient: Home Provider: [name], [place], [place]   I discussed the limitations, risks, security and privacy concerns of performing an evaluation and management service by telephone and the availability of in person appointments. I also discussed with the patient that there may be a patient responsible charge related to this service. The patient expressed understanding and agreed to proceed.  History of Present Illness: For asthma, COVID-19 exposure  58 year old with mild asthma on Breo.  Last seen in clinic 1 year ago Her symptoms have been well controlled with Breo   Observations/Objective: Reports possible exposure at work the week before Thanksgiving Had symptoms of fatigue the weekend after Thanksgiving. Her husband and daughter have also developed symptoms over the past week.  Husband has been tested positive for COVID-19 She has underwent a test today and is awaiting results  Overall she has mild nasal congestion, chest discomfort.  No dyspnea, fevers,  Assessment and Plan: Mild asthma Continue Breo  Eval for COVID-19 Awaiting test results. Advised her to stay isolated and hydrated Use over-the-counter medication for nasal congestion Symptoms onset is more than 10 days ago and she would not qualify for antibodies at this point She will contact the hotline to see if her husband can get evaluated for antibodies  Follow Up Instructions: Continue Breo Over-the-counter medication Follow-up in 2 to 4 weeks   I discussed the assessment and treatment plan with the patient. The patient was provided an opportunity to ask questions and all were answered. The patient agreed with the plan and demonstrated an understanding of the instructions.   The patient was  advised to call back or seek an in-person evaluation if the symptoms worsen or if the condition fails to improve as anticipated.  I provided 25 minutes of non-face-to-face time during this encounter.   Marshell Garfinkel MD [facility] 03/26/2019, 11:29 AM

## 2019-03-26 NOTE — Patient Instructions (Signed)
Please stay safe and isolated Drink plenty of fluids Continue Breo Can use over-the-counter medication for congestion  Follow-up in 2 to 4 weeks

## 2019-03-27 ENCOUNTER — Telehealth: Payer: Self-pay | Admitting: Pulmonary Disease

## 2019-03-27 NOTE — Telephone Encounter (Signed)
Spoke with pt She states that her and Dr Vaughan Browner had discussed him seeing her daughter, bc she is having cough and SOB and her covid test just came back negative  However, her father is positive  Per Dr Vaughan Browner- schedule for the first wk in Jan 2021  Appt scheduled

## 2019-04-10 ENCOUNTER — Telehealth: Payer: Self-pay | Admitting: Pulmonary Disease

## 2019-04-10 NOTE — Telephone Encounter (Signed)
She can be seen tomorrow in the office as long as she passes the screening questions. We would prefer a video visit, but if she insists on an in office visit we can put her on a schedule tomorrow. The provider can determine if they agree to see her in office vs virtually. Thanks

## 2019-04-10 NOTE — Telephone Encounter (Signed)
Left message for patient to call back  

## 2019-04-10 NOTE — Telephone Encounter (Signed)
Patient last seen 10.8.19 by Dr Vaughan Browner  Patient stated spouse test + COVID on 12/1, patient last tested negative on 12/10  Patient c/o head/sinus congestion x1 month with sinus pressure, PND, bilateral ear pressure L greater than R, increased cough and chest heaviness with yellowish mucus production from sinuses and chest.  Patient has been using OTC therapies including Aleve Cold&Sinus, TheraFlu, Vicks, Flonase, Astelin.  Denies any hemoptysis, wheezing.  Patient is requesting an in-office visit though it was expressed to patient with rising cases in the community we are urging telephone/video visits for patients presenting with sick symptoms.  Patient adamant about coming in to the office.  Sarah please advise if this is okay to do.  **of note, pt did state that their daughter is scheduled to see Dr Vaughan Browner 04/17/19 and pt will accompany daughter to the visit**

## 2019-04-11 ENCOUNTER — Encounter: Payer: Self-pay | Admitting: Primary Care

## 2019-04-11 ENCOUNTER — Other Ambulatory Visit: Payer: Self-pay

## 2019-04-11 ENCOUNTER — Ambulatory Visit (INDEPENDENT_AMBULATORY_CARE_PROVIDER_SITE_OTHER): Payer: 59 | Admitting: Primary Care

## 2019-04-11 DIAGNOSIS — J0100 Acute maxillary sinusitis, unspecified: Secondary | ICD-10-CM | POA: Diagnosis not present

## 2019-04-11 DIAGNOSIS — J069 Acute upper respiratory infection, unspecified: Secondary | ICD-10-CM | POA: Diagnosis not present

## 2019-04-11 MED ORDER — PREDNISONE 10 MG PO TABS: ORAL_TABLET | ORAL | 0 refills | Status: DC

## 2019-04-11 MED ORDER — ALBUTEROL SULFATE HFA 108 (90 BASE) MCG/ACT IN AERS: INHALATION_SPRAY | RESPIRATORY_TRACT | 2 refills | Status: AC

## 2019-04-11 MED ORDER — AZITHROMYCIN 250 MG PO TABS: ORAL_TABLET | ORAL | 0 refills | Status: DC

## 2019-04-11 NOTE — Progress Notes (Signed)
Virtual Visit via Telephone Note  I connected with Elizabeth Roth on 04/11/19 at 10:00 AM EST by telephone and verified that I am speaking with the correct person using two identifiers.  Location: Patient: Home Provider: Office   I discussed the limitations, risks, security and privacy concerns of performing an evaluation and management service by telephone and the availability of in person appointments. I also discussed with the patient that there may be a patient responsible charge related to this service. The patient expressed understanding and agreed to proceed.   History of Present Illness: 58 year old female, former smoker. PMH significant for reactive airway disease, spontaneous pneumothorax, hypothyroidism. Patient of Dr. Vaughan Browner, last seen on 03/26/19 for video visit. During that visit she reported possible COVID exposure and symptoms of fatigue. Maintained on Breo 200.    04/11/2019 Patient contacted today for televisit. Reports head/sinus congestion with yellow mucus x1 month. Associated PND, ear pressure, chest heaviness. No significant shortness of breath or wheezing. She has been using her Breo daily as prescribed, needs refill of Albuterol rescue inhaler. She has tried over the counter aleve cold and sinus, Flonase, Theraflu and Vicks vapor rub with little improvement. Her husband who tested positive for COVID in November is doing well. Reports she tested negative for COVID at urgent care earlier this month. Her children were also sick with similar symptoms. Denies chest pain.   Observations/Objective:  - Audible nasal congestion - No significant shortness of breath/wheezing or cough  Assessment and Plan:  Acute sinusitis  - Rx zpack (allergy to Augmentin) and prednisone 20mg  x 5 days - Recommend mucinex 600mg  twice daily - Continue flonase nasal spray once daily  Asthma - Continue Breo 200 one puff once daily - Refill Albuterol hfa 2 puffs every 4-6 hours   Follow Up  Instructions:   - As needed if symptoms do not improve or worsen   I discussed the assessment and treatment plan with the patient. The patient was provided an opportunity to ask questions and all were answered. The patient agreed with the plan and demonstrated an understanding of the instructions.   The patient was advised to call back or seek an in-person evaluation if the symptoms worsen or if the condition fails to improve as anticipated.  I provided 18 minutes of non-face-to-face time during this encounter.   Martyn Ehrich, NP

## 2019-04-11 NOTE — Telephone Encounter (Signed)
Spoke with pt. She is aware of Sarah's response. Pt DID NOT pass the COVID screening questions. She has been scheduled for a televisit with Beth this morning at 1000. Pt does not have access to MyChart. Nothing further was needed.

## 2019-04-11 NOTE — Patient Instructions (Addendum)
  Acute sinusitis  - Rx zpack and prednisone 20mg  x 5 days - Continue Breo 200 one puff once daily - Refill Albuterol hfa 2 puffs every 4-6 hours  - Recommend mucinex 600mg  twice daily - Continue flonase nasal spray once daily - Monitor for worsening shortness of breath, purulent mucus or fevers  Follow-up - If symptoms do not improve or worsen

## 2019-05-09 ENCOUNTER — Encounter: Payer: Self-pay | Admitting: Pulmonary Disease

## 2019-05-09 ENCOUNTER — Ambulatory Visit (INDEPENDENT_AMBULATORY_CARE_PROVIDER_SITE_OTHER): Payer: 59

## 2019-05-09 ENCOUNTER — Other Ambulatory Visit: Payer: Self-pay

## 2019-05-09 ENCOUNTER — Ambulatory Visit: Payer: 59 | Admitting: Pulmonary Disease

## 2019-05-09 VITALS — BP 110/68 | HR 76 | Temp 97.2°F | Ht 65.0 in | Wt 143.4 lb

## 2019-05-09 DIAGNOSIS — J4540 Moderate persistent asthma, uncomplicated: Secondary | ICD-10-CM

## 2019-05-09 MED ORDER — FLUTICASONE PROPIONATE 50 MCG/ACT NA SUSP: NASAL | 11 refills | Status: AC

## 2019-05-09 NOTE — Progress Notes (Signed)
RELMA WIACEK    [id number]    [birth year]  Primary Care Physician:Taavon, Delfino Lovett, MD  Referring Physician: Brien Few, MD [place],  [place] [address]  Chief complaint: Follow-up for asthma  HPI: 59 year old with diagnosis of asthma, recurrent bronchitis, left pneumothorax.  Stable on Breo inhaler  History noted for spontaneous left pneumothorax more than 10 years ago.  This was treated conservatively.  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 Exposures: No known exposures, no mold, dampness, hot tub, Jacuzzi Smoking history: Social smoker in 1980s as a teenager Travel history: Originally from [place].  No significant travel  Interim history: Has been diagnosed with Covid around Thanksgiving, early December.  Patient was tested negative for PCR however her IgG serologies later turned out positive She had asthma, bronchitis exacerbation of December treated with Z-Pak, prednisone for 5 days  Overall she is doing better with her breathing.  Continues on [place].  Hardly needs to use her rescue inhaler.  Outpatient Encounter Medications as of 05/09/2019  Medication Sig  . acetaminophen (TYLENOL) 500 MG tablet Take 1,000 mg by mouth every 6 (six) hours as needed for moderate pain.  Marland Kitchen albuterol (VENTOLIN HFA) 108 (90 Base) MCG/ACT inhaler 1-2 puffs every 4-6 hours as needed  . fluticasone (FLONASE) 50 MCG/ACT nasal spray 1 spray in each nostril twice a day as needed. Use the "crossover" technique as discussed  . fluticasone furoate-vilanterol (BREO ELLIPTA) 200-25 MCG/INH AEPB Inhale 1 puff into the lungs daily.  Marland Kitchen ibuprofen (ADVIL,MOTRIN) 600 MG tablet Take 1 tablet (600 mg total) by mouth every 6 (six) hours as needed.  Marland Kitchen levothyroxine (SYNTHROID, LEVOTHROID) 50 MCG tablet Take 50 mcg by mouth daily.  . Omega-3 Fatty Acids (FISH OIL  OMEGA-3 PO) Take 2,000 mg by mouth.  Marland Kitchen omeprazole (PRILOSEC) 40 MG capsule Take 1 capsule (40 mg total) by mouth daily.  . [DISCONTINUED] azithromycin (ZITHROMAX) 250 MG tablet Zpack taper as directed  . [DISCONTINUED] predniSONE (DELTASONE) 10 MG tablet Take 2 tabs x 5 days   No facility-administered encounter medications on file as of 05/09/2019.   Physical Exam: Blood pressure 110/68, pulse 76, temperature (!) 97.2 F (36.2 C), temperature source Temporal, height 5\' 5"  (1.651 m), weight 143 lb 6.4 oz (65 kg), last menstrual period 09/12/2011, SpO2 97 %. Gen:      No acute distress HEENT:  EOMI, sclera anicteric Neck:     No masses; no thyromegaly Lungs:    Clear to auscultation bilaterally; normal respiratory effort CV:         Regular rate and rhythm; no murmurs Abd:      + bowel sounds; soft, non-tender; no palpable masses, no distension Ext:    No edema; adequate peripheral perfusion Skin:      Warm and dry; no rash Neuro: alert and oriented x 3 Psych: normal mood and affect  Data Reviewed: Chest x-ray 07/14/2017- no acute cardiopulmonary abnormality.  I reviewed the images personally.  PFTs 12/20/2017 FVC 2.72 [82%), FEV1 2.48 [95%), F/F 91, TLC 107%, DLCO 86% Normal test  Labs CBC 09/28/2017-WBC 8.3, eos 1.7%, absolute eosinophil count 141  Assessment:  Follow-up for asthma No significant obstruction on pulmonary function test however she has recurrent attacks of wheezing, bronchitis and has responded well to [facility] Continue inhaler therapy for now Chest x-ray  Plan/Recommendations: -  Continue Breo, continue albuterol as needed - CXR  Marshell Garfinkel MD [facility] 05/09/2019, 12:33 PM  CC: Brien Few, MD

## 2019-05-09 NOTE — Patient Instructions (Signed)
Continue the Breo inhaler We will renew the Flonase inhaler We will get a chest x-ray today Follow-up in 15-month

## 2019-12-16 ENCOUNTER — Other Ambulatory Visit: Payer: Self-pay | Admitting: Primary Care

## 2019-12-17 ENCOUNTER — Other Ambulatory Visit: Payer: Self-pay | Admitting: Primary Care

## 2020-04-02 ENCOUNTER — Other Ambulatory Visit: Payer: Self-pay | Admitting: Pulmonary Disease

## 2020-04-13 ENCOUNTER — Telehealth: Payer: Self-pay | Admitting: Pulmonary Disease

## 2020-04-13 NOTE — Telephone Encounter (Signed)
Left message for patient to call back  

## 2020-04-14 MED ORDER — AZITHROMYCIN 250 MG PO TABS: ORAL_TABLET | ORAL | 0 refills | Status: AC

## 2020-04-14 NOTE — Telephone Encounter (Signed)
Pt aware of recs.  rx sent to preferred pharmacy.  Nothing further needed at this time- will close encounter.   

## 2020-04-14 NOTE — Telephone Encounter (Signed)
Please call in Z-Pak. 

## 2020-04-14 NOTE — Telephone Encounter (Signed)
Primary Pulmonologist: Dr. Isaiah Serge Last office visit and with whom: 05/09/2019 Dr. Isaiah Serge What do we see them for (pulmonary problems): Asthma Last OV assessment/plan:   Assessment:  Follow-up for asthma No significant obstruction on pulmonary function test however she has recurrent attacks of wheezing, bronchitis and has responded well to [facility] Continue inhaler therapy for now Chest x-ray  Plan/Recommendations: - Continue Breo, continue albuterol as needed - CXR  Chilton Greathouse MD [facility] 05/09/2019, 12:33 PM  CC: Olivia Mackie, MD      Patient Instructions by Chilton Greathouse, MD at 05/09/2019 12:00 PM  Author: Chilton Greathouse, MD Author Type: Physician Filed: 05/09/2019 12:34 PM  Note Status: Signed Cosign: Cosign Not Required Encounter Date: 05/09/2019  Editor: Chilton Greathouse, MD (Physician)               Continue the [facility] inhaler We will renew the Flonase inhaler We will get a chest x-ray today Follow-up in 48-month        Instructions  Continue the Breo inhaler We will renew the Flonase inhaler We will get a chest x-ray today Follow-up in 41-month       Reason for call:  She has covid, she was diagnosed at the urgent care on 04/03/2020 and is in quarantine until 04/17/2019.  Cough is improved.  Productive cough with yellow mucus x 2 days, previously clear.  Sinus pressure and ear pressure, sore throat.  Face swollen since prednisone.  She was prescribed 50 mg x 5 days which she has completed.  Sore on top of mouth, feels like a bump, not open.  Covid rash is gone (pill took care of that).  Denies fever, has night sweats, denies chills or body aches.  Denies any wheezing.  She is having chest pressure, does not feel like it is congestion.  No covid vaccinations. Using Flonase and Breo.   Not using anything over the counter for s/s.  Using  Fexofedadine daily.  She is taking Vitamin C, D, E and a multivitamin.  She is concerned about  getting an infection and wants to know if she can be doing anything else for the symptoms.     (examples of things to ask: : When did symptoms start? Fever? Cough? Productive? Color to sputum? More sputum than usual? Wheezing? Have you needed increased oxygen? Are you taking your respiratory medications? What over the counter measures have you tried?)  Allergies  Allergen Reactions  . Augmentin [Amoxicillin-Pot Clavulanate]     VOMITING   . Erythromycin     REACTION: EXCESSIVE VOMITTING AND DIARRHEA, DEHYDRATION  . Latex     Severe irritation    Immunization History  Administered Date(s) Administered  . Influenza,inj,Quad PF,6+ Mos 03/19/2015, 12/20/2017

## 2020-04-18 ENCOUNTER — Other Ambulatory Visit: Payer: Self-pay | Admitting: Pulmonary Disease

## 2020-05-24 ENCOUNTER — Other Ambulatory Visit: Payer: Self-pay | Admitting: Pulmonary Disease

## 2020-05-24 DIAGNOSIS — J4540 Moderate persistent asthma, uncomplicated: Secondary | ICD-10-CM

## 2021-02-18 ENCOUNTER — Other Ambulatory Visit: Payer: Self-pay | Admitting: Obstetrics and Gynecology

## 2021-02-18 DIAGNOSIS — N644 Mastodynia: Secondary | ICD-10-CM

## 2021-03-10 LAB — EXTERNAL GENERIC LAB PROCEDURE: COLOGUARD: NEGATIVE

## 2021-04-30 ENCOUNTER — Other Ambulatory Visit: Payer: 59

## 2021-05-26 ENCOUNTER — Ambulatory Visit
Admission: RE | Admit: 2021-05-26 | Discharge: 2021-05-26 | Disposition: A | Discharge status: Home / Self Care | Payer: 59 | Source: Ambulatory Visit | Attending: Obstetrics and Gynecology | Admitting: Obstetrics and Gynecology

## 2021-05-26 ENCOUNTER — Other Ambulatory Visit: Payer: Self-pay

## 2021-05-26 DIAGNOSIS — N644 Mastodynia: Secondary | ICD-10-CM

## 2023-05-16 ENCOUNTER — Other Ambulatory Visit (HOSPITAL_BASED_OUTPATIENT_CLINIC_OR_DEPARTMENT_OTHER): Payer: Self-pay | Admitting: Nurse Practitioner

## 2023-05-16 DIAGNOSIS — E785 Hyperlipidemia, unspecified: Secondary | ICD-10-CM

## 2023-05-23 ENCOUNTER — Ambulatory Visit (HOSPITAL_BASED_OUTPATIENT_CLINIC_OR_DEPARTMENT_OTHER): Admission: RE | Admit: 2023-05-23 | Payer: Self-pay | Source: Ambulatory Visit

## 2023-05-26 ENCOUNTER — Ambulatory Visit (HOSPITAL_BASED_OUTPATIENT_CLINIC_OR_DEPARTMENT_OTHER)
Admission: RE | Admit: 2023-05-26 | Discharge: 2023-05-26 | Disposition: A | Discharge status: Home / Self Care | Payer: Self-pay | Source: Ambulatory Visit | Attending: Nurse Practitioner | Admitting: Nurse Practitioner

## 2023-05-26 DIAGNOSIS — E785 Hyperlipidemia, unspecified: Secondary | ICD-10-CM | POA: Insufficient documentation

## 2023-12-13 IMAGING — MG MM DIGITAL DIAGNOSTIC UNILAT*R* W/ TOMO W/ CAD
8 series · 8 of 24 positions shown · non-contrast
Comparison: Previous exams.

CLINICAL DATA: 60-year-old female with nonfocal pain throughout the
outer right breast. History of remote benign right breast excision.
Strong family history of breast cancer, including in patient's
mother deceased from metastatic breast cancer at age 41.

EXAM:
DIGITAL DIAGNOSTIC UNILATERAL RIGHT MAMMOGRAM WITH TOMOSYNTHESIS AND
CAD; ULTRASOUND RIGHT BREAST LIMITED
TECHNIQUE: Right digital diagnostic mammography and breast tomosynthesis was
performed. The images were evaluated with computer-aided detection.;
Targeted ultrasound examination of the right breast was performed

[R CC synth-2D (1 of 2)]
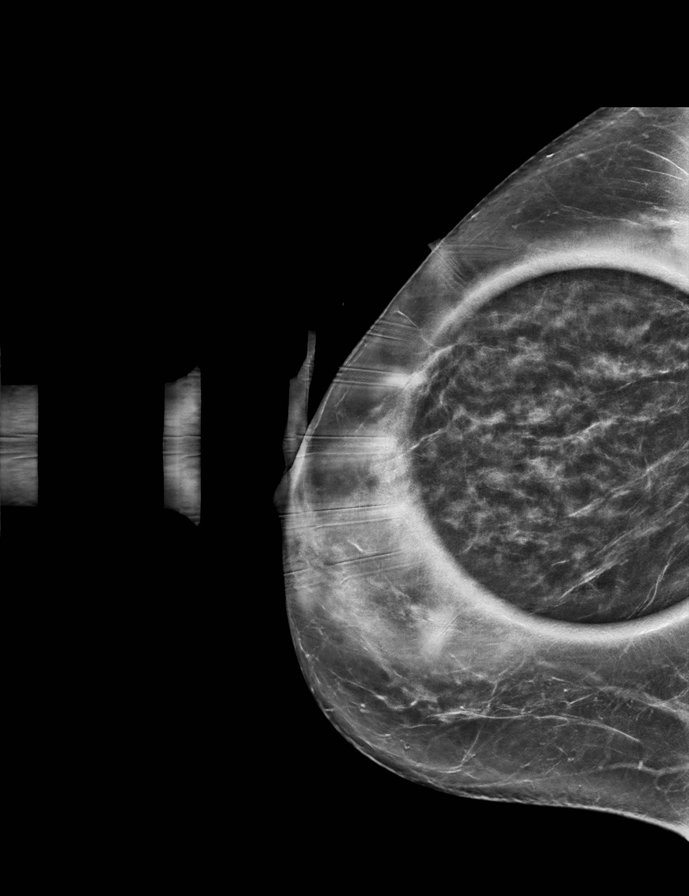

[R CC synth-2D (2 of 2)]
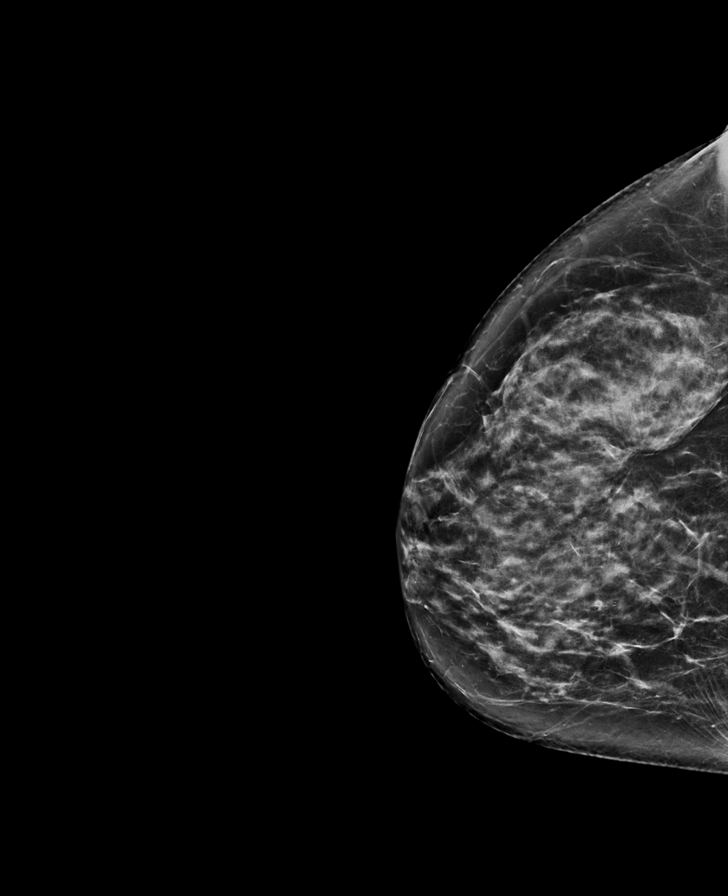

[R MLO synth-2D]
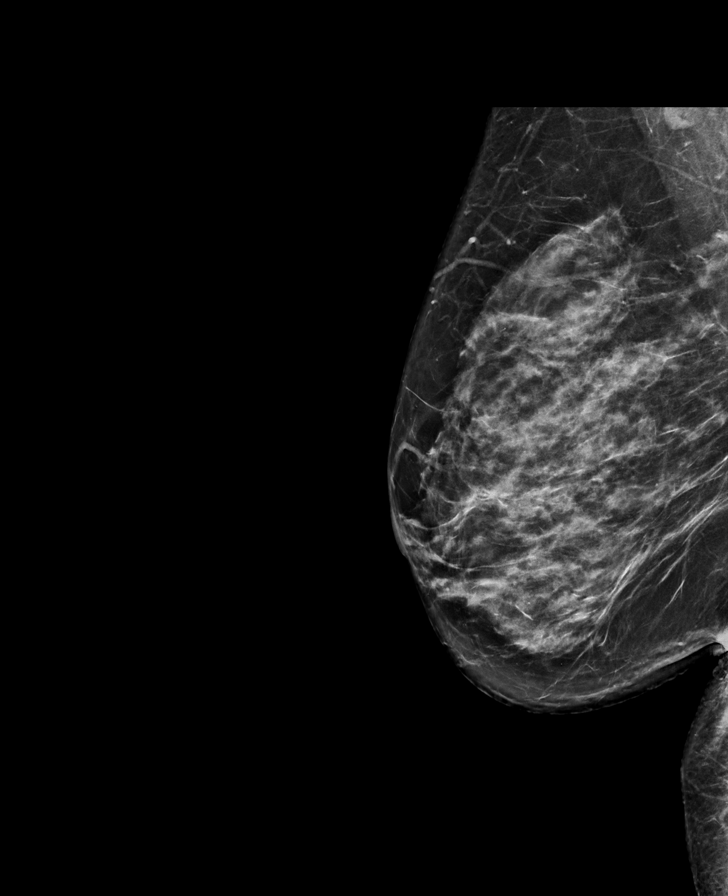

[R ML synth-2D]
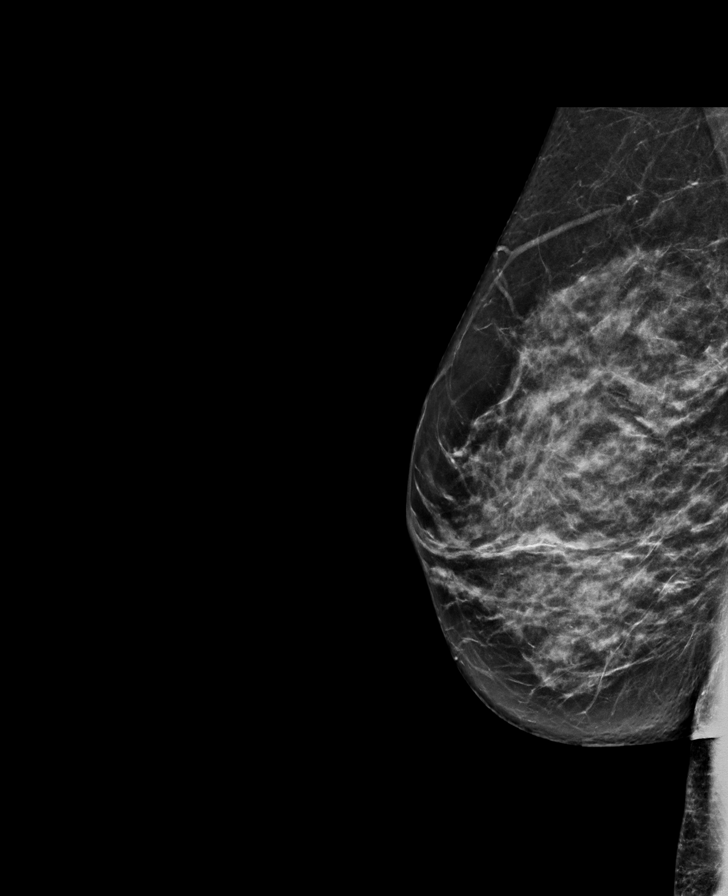

[R ML tomo · tomo slice 34/67.0]
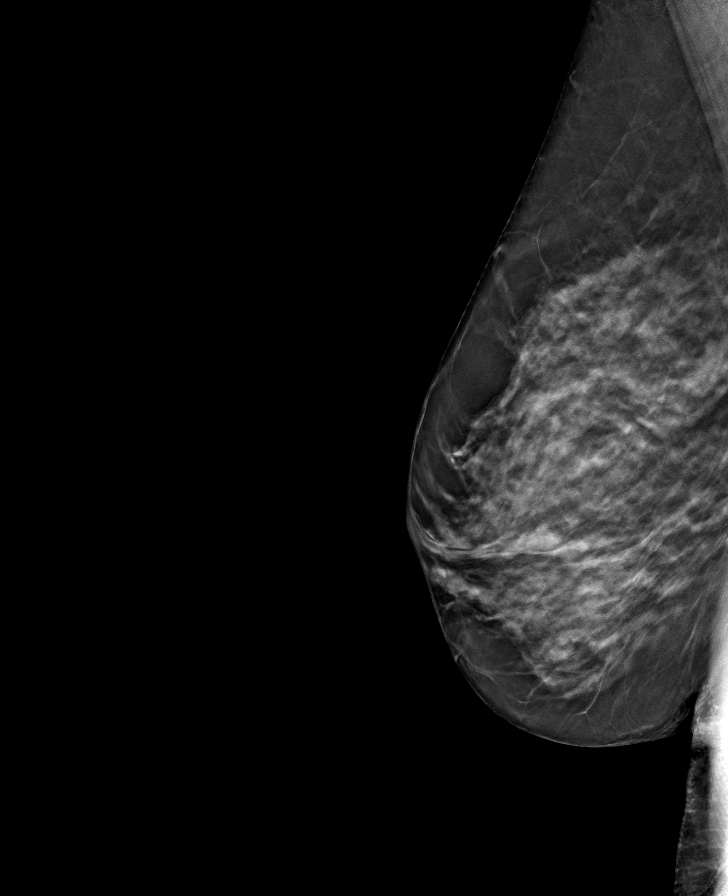

[R CC tomo (1 of 2) · tomo slice 39/77.0]
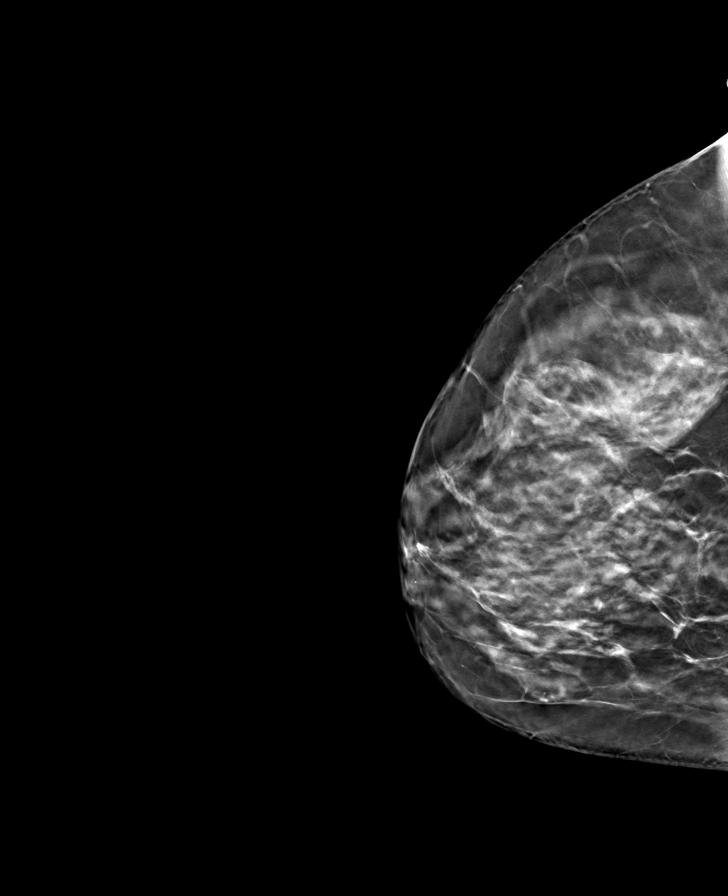

[R MLO tomo · tomo slice 39/77.0]
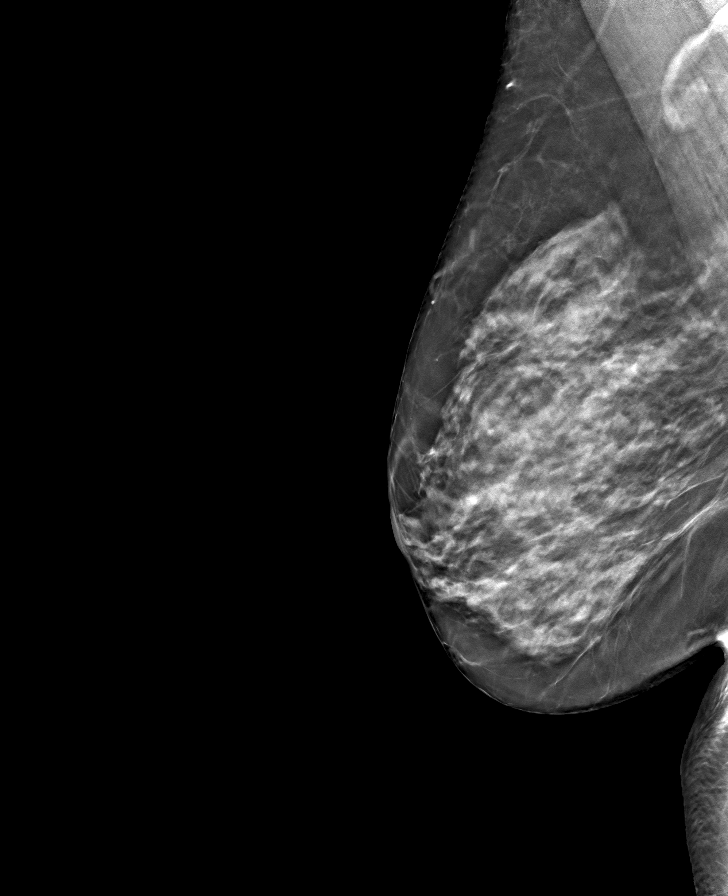

[R CC tomo (2 of 2) · tomo slice 34/67.0]
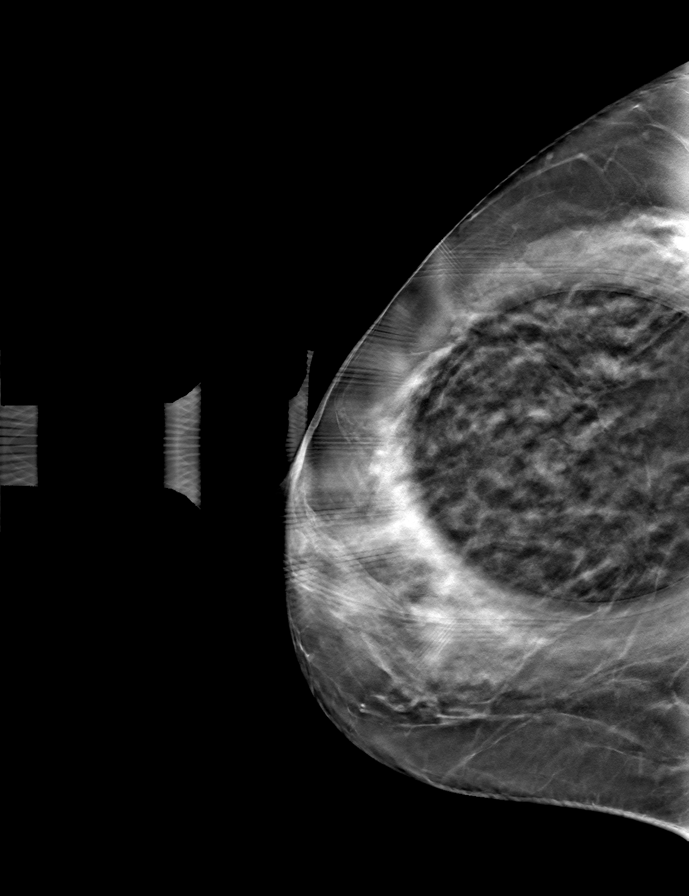

[8 of 24 positions shown; findings below may reference images not displayed]

ACR Breast Density Category d: The breast tissue is extremely dense,
which lowers the sensitivity of mammography.
FINDINGS: No suspicious masses or calcifications seen in the right breast.
Postsurgical scarring from prior excision is again identified in the
upper-outer right breast, stable in appearance. There is no
mammographic evidence of malignancy in the right breast.

Targeted ultrasound of the upper-outer right breast was performed.
Postsurgical scarring is seen sonographically in the right breast at
the approximate 10 o'clock position 4 cm from nipple however no
suspicious masses or abnormalities identified.
IMPRESSION: No mammographic or sonographic abnormalities in the region of
tenderness in the outer right breast. There is no mammographic
evidence of malignancy.

RECOMMENDATION:
1. Recommend further management of nonfocal right breast pain be
based on clinical assessment.

2. The patient has a strong family history of breast cancer,
including in her mother deceased at age 41 from breast cancer.
Recommend genetic counseling if this has not already been performed
and consideration of screening breast MRI given strong family
history and extremely dense fibroglandular tissue with postsurgical
change in the right breast.

3.  Recommend annual screening mammography, due January 2022.

I have discussed the findings and recommendations with the patient.
If applicable, a reminder letter will be sent to the patient
regarding the next appointment.

BI-RADS CATEGORY  1: Negative.

## 2023-12-13 IMAGING — US US BREAST*R* LIMITED INC AXILLA
1 series · 7 of 7 positions shown · non-contrast
Comparison: Previous exams.

CLINICAL DATA: 60-year-old female with nonfocal pain throughout the
outer right breast. History of remote benign right breast excision.
Strong family history of breast cancer, including in patient's
mother deceased from metastatic breast cancer at age 41.

EXAM:
DIGITAL DIAGNOSTIC UNILATERAL RIGHT MAMMOGRAM WITH TOMOSYNTHESIS AND
CAD; ULTRASOUND RIGHT BREAST LIMITED
TECHNIQUE: Right digital diagnostic mammography and breast tomosynthesis was
performed. The images were evaluated with computer-aided detection.;
Targeted ultrasound examination of the right breast was performed

[Series 1: us breast*right* limited inc axilla · 0.06mm/px · 7 of 7 slices shown]
[im 1/7]
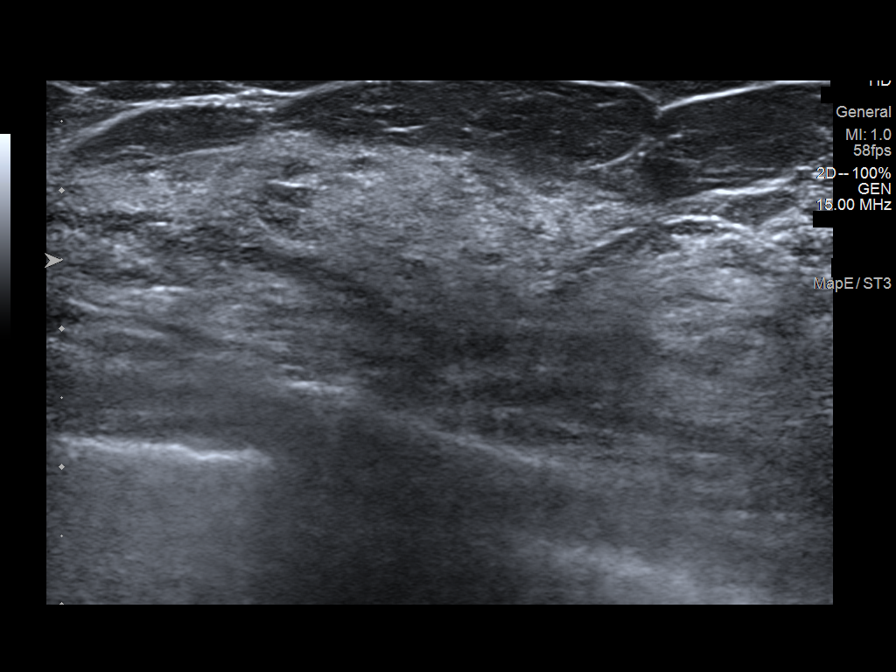
[im 2/7]
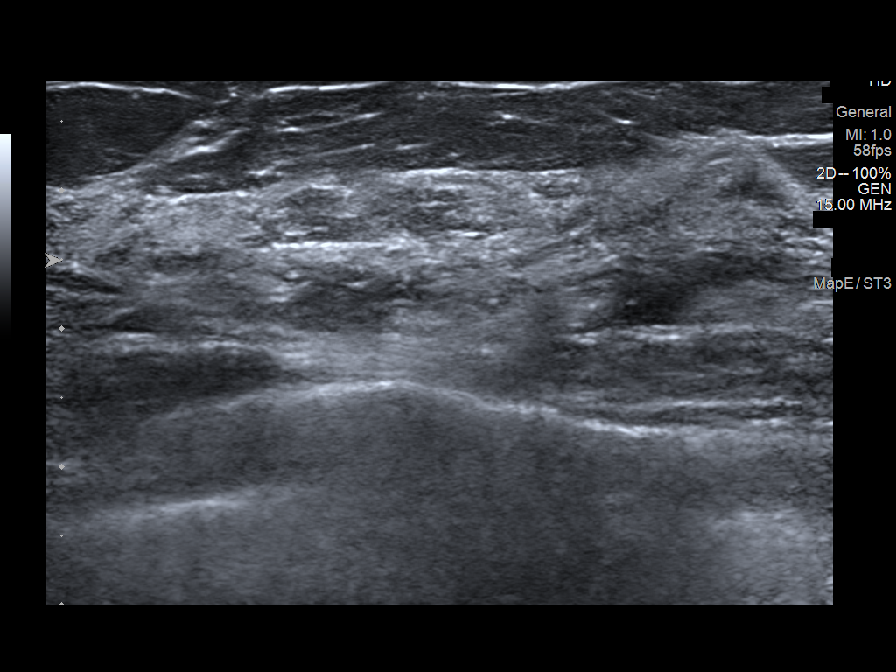
[im 3/7]
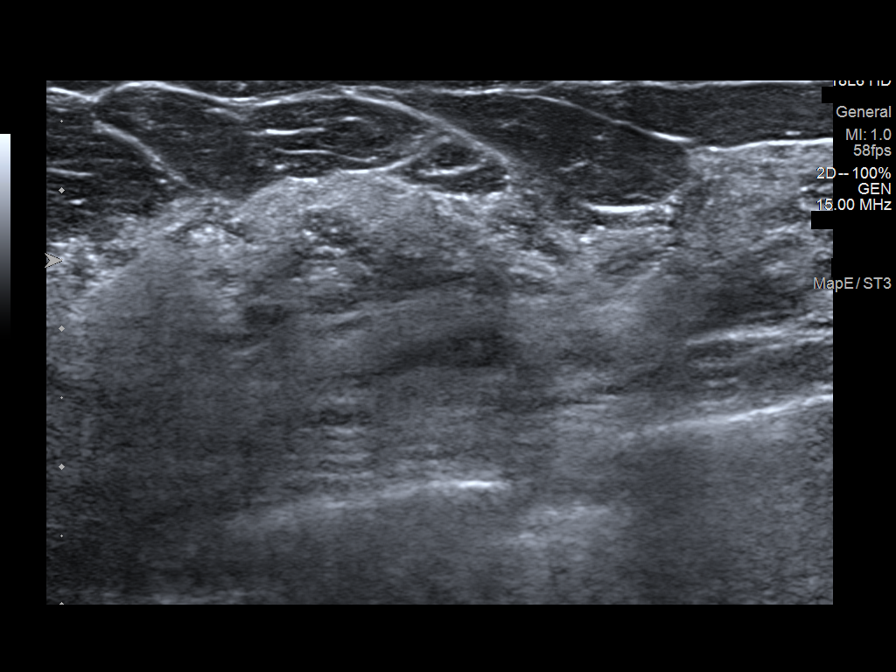
[im 4/7]
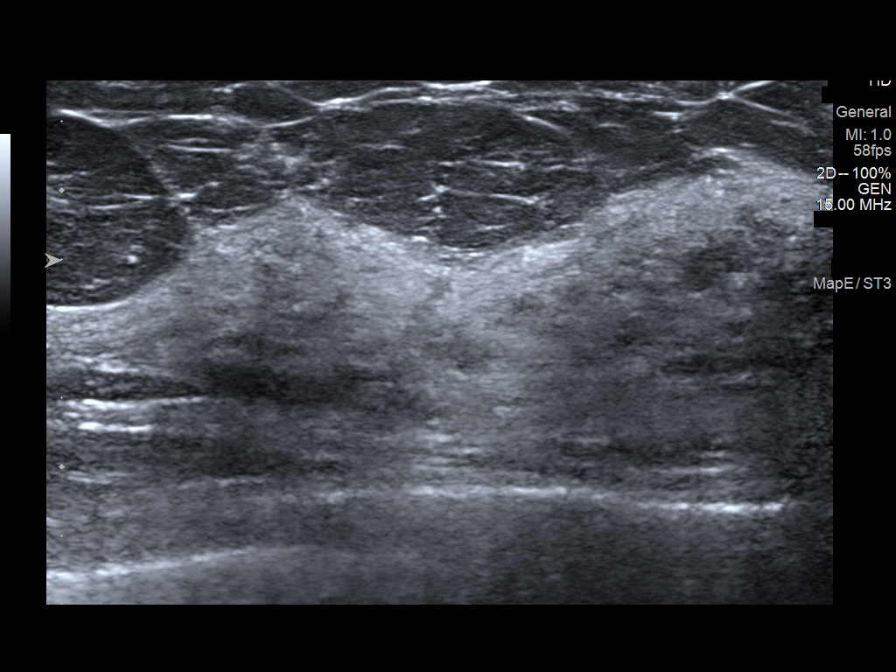
[im 5/7]
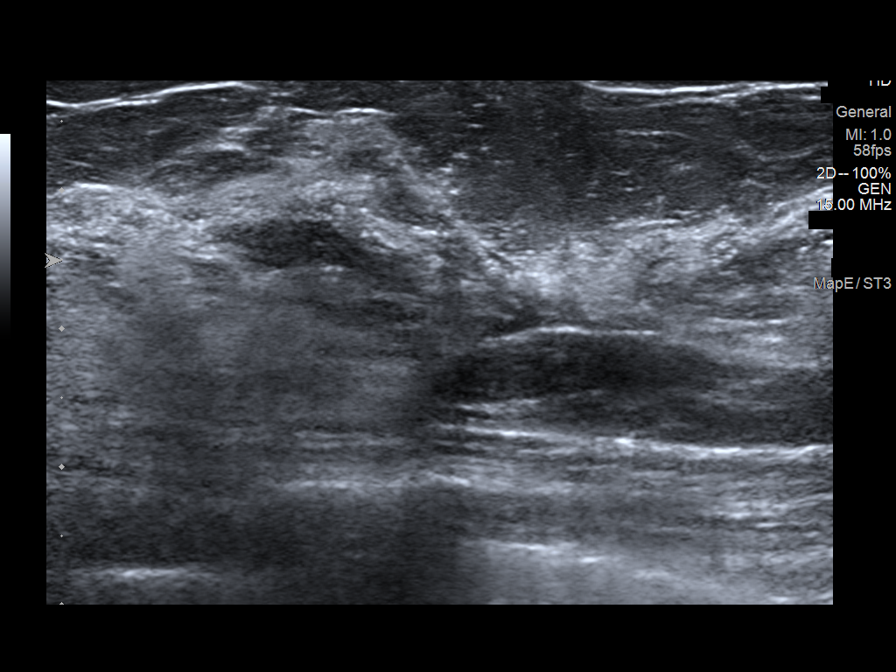
[im 6/7]
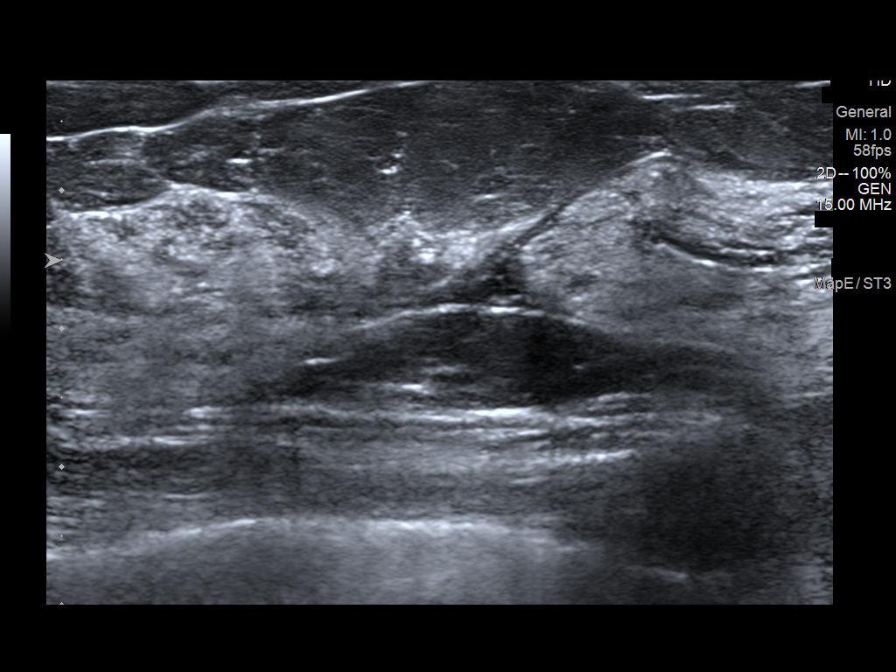
[im 7/7]
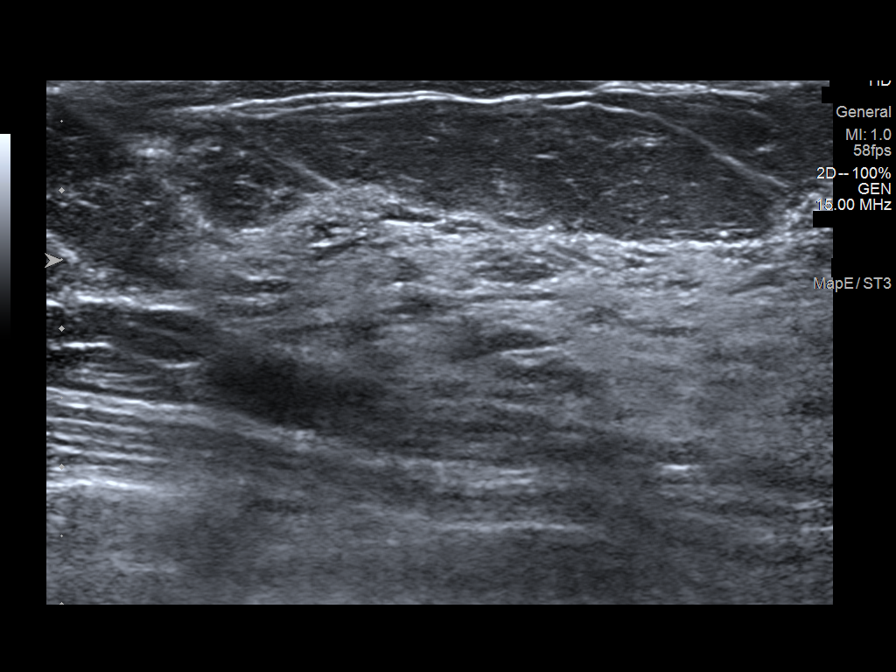

[7 of 7 positions shown; findings below may reference images not displayed]

ACR Breast Density Category d: The breast tissue is extremely dense,
which lowers the sensitivity of mammography.
FINDINGS: No suspicious masses or calcifications seen in the right breast.
Postsurgical scarring from prior excision is again identified in the
upper-outer right breast, stable in appearance. There is no
mammographic evidence of malignancy in the right breast.

Targeted ultrasound of the upper-outer right breast was performed.
Postsurgical scarring is seen sonographically in the right breast at
the approximate 10 o'clock position 4 cm from nipple however no
suspicious masses or abnormalities identified.
IMPRESSION: No mammographic or sonographic abnormalities in the region of
tenderness in the outer right breast. There is no mammographic
evidence of malignancy.

RECOMMENDATION:
1. Recommend further management of nonfocal right breast pain be
based on clinical assessment.

2. The patient has a strong family history of breast cancer,
including in her mother deceased at age 41 from breast cancer.
Recommend genetic counseling if this has not already been performed
and consideration of screening breast MRI given strong family
history and extremely dense fibroglandular tissue with postsurgical
change in the right breast.

3.  Recommend annual screening mammography, due January 2022.

I have discussed the findings and recommendations with the patient.
If applicable, a reminder letter will be sent to the patient
regarding the next appointment.

BI-RADS CATEGORY  1: Negative.
